# Patient Record
Sex: Female | Born: 1960 | Race: White | Hispanic: No | Marital: Married | Smoking: Former smoker
Health system: Southern US, Community
[De-identification: ages and names within clinical notes are randomized; demographics above are authoritative.]

## PROBLEM LIST (undated history)

## (undated) DIAGNOSIS — F32A Depression, unspecified: Secondary | ICD-10-CM

## (undated) DIAGNOSIS — T7840XA Allergy, unspecified, initial encounter: Secondary | ICD-10-CM

## (undated) DIAGNOSIS — R7303 Prediabetes: Secondary | ICD-10-CM

## (undated) DIAGNOSIS — M1990 Unspecified osteoarthritis, unspecified site: Secondary | ICD-10-CM

## (undated) DIAGNOSIS — E785 Hyperlipidemia, unspecified: Secondary | ICD-10-CM

## (undated) DIAGNOSIS — F419 Anxiety disorder, unspecified: Secondary | ICD-10-CM

## (undated) DIAGNOSIS — F329 Major depressive disorder, single episode, unspecified: Secondary | ICD-10-CM

## (undated) HISTORY — PX: TONSILLECTOMY: SUR1361

## (undated) HISTORY — PX: WISDOM TOOTH EXTRACTION: SHX21

## (undated) HISTORY — PX: GUM SURGERY: SHX658

## (undated) HISTORY — DX: Hyperlipidemia, unspecified: E78.5

## (undated) HISTORY — DX: Depression, unspecified: F32.A

## (undated) HISTORY — DX: Prediabetes: R73.03

## (undated) HISTORY — DX: Allergy, unspecified, initial encounter: T78.40XA

## (undated) HISTORY — DX: Unspecified osteoarthritis, unspecified site: M19.90

## (undated) HISTORY — DX: Anxiety disorder, unspecified: F41.9

## (undated) HISTORY — PX: TUBAL LIGATION: SHX77

---
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
## 2016-12-23 LAB — CBC AND DIFFERENTIAL
HEMATOCRIT: 44 (ref 36–46)
Hemoglobin: 14.7 (ref 12.0–16.0)
NEUTROS ABS: 3046
PLATELETS: 302 (ref 150–399)
WBC: 6.4

## 2016-12-23 LAB — BASIC METABOLIC PANEL
BUN: 14 (ref 4–21)
Creatinine: 1 (ref <=1.1)
GLUCOSE: 91
Potassium: 4.5 (ref 3.4–5.3)
SODIUM: 141 (ref 137–147)

## 2016-12-23 LAB — LIPID PANEL
Cholesterol: 277 — AB (ref 0–200)
HDL: 39 (ref 35–70)
LDL CALC: 209
TRIGLYCERIDES: 137 (ref 40–160)

## 2016-12-23 LAB — HEPATIC FUNCTION PANEL
ALT: 27 (ref 7–35)
AST: 25 (ref 13–35)
Alkaline Phosphatase: 78 (ref 25–125)
Bilirubin, Total: 0.5

## 2016-12-23 LAB — HEMOGLOBIN A1C: Hgb A1c MFr Bld: 5.7 (ref 4.0–6.0)

## 2016-12-23 LAB — TSH: TSH: 3.75 (ref <=5.90)

## 2018-05-17 NOTE — Progress Notes (Signed)
Subjective:    Patient ID: Barbara [name], female    DOB: 1961/01/11, 57 y.o.   MRN: [id number]  HPI :  Barbara [name] is here to establish as a new pt.  She is a pleasant 57 year old female. PMH: She denies chronic "formal medical conditions", however has several concerns today. 1) Hx of herniated disc >20 years ago, confirmed via MRI per pt.  She has been treated by chiropractor in past with only minimal sx relief. She reports intermittent low back pain with radiation down RLE. She also reports numbness/tingling in RLE 2) Pain in R axillary that will radiate to middle humerus Pain is intermittent, described as dull ache and rated 1/10 She denies numbness/tingling in R hand  Pain has been present >1 year She denies acute injury/trauma prior to onset of sx's Her last mammogram >5 years ago and was normal She denies first degree family hx of breast ca 3) "Worried about my sugars" Last set of labs >2 years, denies being told that she was either pre-diabetic or diabetic She estimates to drink 10 ox water/day, consumes two cups of coffee throughout day She quite tobacco use 2013 and has not consumed ETOH >2 years She denies regular exercise She stopped Citalopram 5mg  QD several months ago and reports stable mood She estimates to have had gained >30 lbs in the last 5 years  Patient Care Team    Relationship Specialty Notifications Start End  Esaw Grandchild, NP PCP - General Family Medicine  04/17/18     Patient Active Problem List   Diagnosis Date Noted  . Healthcare maintenance 05/18/2018  . Screening for breast cancer 05/18/2018  . Axillary pain, right 05/18/2018     History reviewed. No pertinent past medical history.   Past Surgical History:  Procedure Laterality Date  . [place]  . TUBAL LIGATION       Family History  Problem Relation Age of Onset  . Heart attack Father   . Diabetes Sister   . Heart attack Brother   . Diabetes Brother   .  Heart attack Maternal Grandfather   . Diabetes Maternal Grandfather   . Cancer Paternal Grandmother 58       breast  . Heart attack Paternal Grandfather      Social History   Substance and Sexual Activity  Drug Use Never     Social History   Substance and Sexual Activity  Alcohol Use Never  . Frequency: Never     Social History   Tobacco Use  Smoking Status Former Smoker  . Packs/day: 0.75  . Years: 30.00  . Pack years: 22.50  . Types: Cigarettes  . Last attempt to quit: 09/21/2011  . Years since quitting: 6.6  Smokeless Tobacco Never Used     No outpatient encounter medications on file as of 05/18/2018.   No facility-administered encounter medications on file as of 05/18/2018.     Allergies: Patient has no known allergies.  Body mass index is 23.66 kg/m.  Blood pressure 93/61, pulse 61, height 5' 3.5" (1.613 m), weight 135 lb 11.2 oz (61.6 kg), SpO2 98 %.  Review of Systems  Constitutional: Positive for fatigue. Negative for activity change, appetite change, chills, diaphoresis, fever and unexpected weight change.  HENT: Negative for congestion.   Respiratory: Negative for cough, chest tightness, shortness of breath, wheezing and stridor.   Cardiovascular: Negative for chest pain, palpitations and leg swelling.  Gastrointestinal: Negative for abdominal distention, abdominal  pain, blood in stool, constipation, diarrhea, nausea and vomiting.  Genitourinary: Negative for difficulty urinating, flank pain and hematuria.  Musculoskeletal: Positive for arthralgias, back pain and myalgias. Negative for gait problem, joint swelling, neck pain and neck stiffness.  Skin: Negative.  Negative for pallor, rash and wound.  Neurological: Negative for dizziness and headaches.  Hematological: Does not bruise/bleed easily.  Psychiatric/Behavioral: Negative for behavioral problems, confusion, decreased concentration, dysphoric mood, hallucinations, self-injury, sleep disturbance  and suicidal ideas. The patient is not nervous/anxious and is not hyperactive.        Objective:   Physical Exam  Constitutional: She is oriented to person, place, and time. She appears well-developed and well-nourished. No distress.  HENT:  Head: Normocephalic and atraumatic.  Right Ear: External ear normal.  Left Ear: External ear normal.  Nose: Nose normal.  Mouth/Throat: Oropharynx is clear and moist.  Cardiovascular: Normal rate, regular rhythm, normal heart sounds and intact distal pulses.  Pulmonary/Chest: Effort normal and breath sounds normal. No stridor. No respiratory distress. She has no wheezes. She has no rales. She exhibits no tenderness.  Musculoskeletal: She exhibits tenderness. She exhibits no edema.       Lumbar back: She exhibits tenderness.       Right upper arm: She exhibits tenderness and swelling. She exhibits no bony tenderness, no edema, no deformity and no laceration.       Left upper arm: Normal.  Neurological: She is alert and oriented to person, place, and time. Coordination normal.  Skin: Skin is warm and dry. Capillary refill takes less than 2 seconds. No rash noted. She is not diaphoretic. No erythema. No pallor.  Psychiatric: She has a normal mood and affect. Her behavior is normal. Judgment and thought content normal.  Nursing note and vitals reviewed.     Assessment & Plan:   1. Screening for breast cancer   2. Screening for colon cancer   3. Former heavy tobacco smoker   4. Healthcare maintenance   5. Need for zoster vaccination   6. Family history of diabetes mellitus in first degree relative   7. Axillary pain, right   8. Back pain with radiculopathy     Healthcare maintenance  Physical Therapy referral placed. Ultrasound of right armpit/upper extremity placed. Recommend completing your mammogram ASAP. Return in 3 weeks for fasting labs and 4 weeks for complete physical. Increase water intake, strive for at least 65 ounces/day.    Follow Mediterranean Diet. Increase regular exercise.  Recommend at least 30 minutes daily, 5 days per week of walking, jogging, biking, swimming, YouTube/Pinterest workout videos.  Screening for breast cancer Mammogram ordered  Axillary pain, right [place] ordered    FOLLOW-UP:  Return in about 1 month (around 06/18/2018) for CPE, Fasting Labs.

## 2018-05-18 ENCOUNTER — Ambulatory Visit (INDEPENDENT_AMBULATORY_CARE_PROVIDER_SITE_OTHER): Payer: 59 | Admitting: Adult Health

## 2018-05-18 ENCOUNTER — Ambulatory Visit: Payer: Self-pay | Admitting: Adult Health

## 2018-05-18 ENCOUNTER — Other Ambulatory Visit: Payer: Self-pay | Admitting: Adult Health

## 2018-05-18 ENCOUNTER — Encounter: Payer: Self-pay | Admitting: Adult Health

## 2018-05-18 ENCOUNTER — Telehealth: Payer: Self-pay | Admitting: Adult Health

## 2018-05-18 VITALS — BP 93/61 | HR 61 | Ht 63.5 in | Wt 135.7 lb

## 2018-05-18 DIAGNOSIS — M79621 Pain in right upper arm: Secondary | ICD-10-CM

## 2018-05-18 DIAGNOSIS — Z1211 Encounter for screening for malignant neoplasm of colon: Secondary | ICD-10-CM

## 2018-05-18 DIAGNOSIS — Z1231 Encounter for screening mammogram for malignant neoplasm of breast: Secondary | ICD-10-CM

## 2018-05-18 DIAGNOSIS — Z833 Family history of diabetes mellitus: Secondary | ICD-10-CM

## 2018-05-18 DIAGNOSIS — Z1239 Encounter for other screening for malignant neoplasm of breast: Secondary | ICD-10-CM

## 2018-05-18 DIAGNOSIS — Z0000 Encounter for general adult medical examination without abnormal findings: Secondary | ICD-10-CM | POA: Insufficient documentation

## 2018-05-18 DIAGNOSIS — Z87891 Personal history of nicotine dependence: Secondary | ICD-10-CM

## 2018-05-18 DIAGNOSIS — M5410 Radiculopathy, site unspecified: Secondary | ICD-10-CM

## 2018-05-18 DIAGNOSIS — Z23 Encounter for immunization: Secondary | ICD-10-CM

## 2018-05-18 NOTE — Patient Instructions (Signed)
Mediterranean Diet A Mediterranean diet refers to food and lifestyle choices that are based on the traditions of countries located on the [place] Sea. This way of eating has been shown to help prevent certain conditions and improve outcomes for people who have chronic diseases, like kidney disease and heart disease. What are tips for following this plan? Lifestyle  Cook and eat meals together with your family, when possible.  Drink enough fluid to keep your urine clear or pale yellow.  Be physically active every day. This includes: ? Aerobic exercise like running or swimming. ? Leisure activities like gardening, walking, or housework.  Get 7-8 hours of sleep each night.  If recommended by your health care provider, drink red wine in moderation. This means 1 glass a day for nonpregnant women and 2 glasses a day for men. A glass of wine equals 5 oz (150 mL). Reading food labels  Check the serving size of packaged foods. For foods such as rice and pasta, the serving size refers to the amount of cooked product, not dry.  Check the total fat in packaged foods. Avoid foods that have saturated fat or trans fats.  Check the ingredients list for added sugars, such as corn syrup. Shopping  At the grocery store, buy most of your food from the areas near the walls of the store. This includes: ? Fresh fruits and vegetables (produce). ? Grains, beans, nuts, and seeds. Some of these may be available in unpackaged forms or large amounts (in bulk). ? Fresh seafood. ? Poultry and eggs. ? Low-fat dairy products.  Buy whole ingredients instead of prepackaged foods.  Buy fresh fruits and vegetables in-season from local farmers markets.  Buy frozen fruits and vegetables in resealable bags.  If you do not have access to quality fresh seafood, buy precooked frozen shrimp or canned fish, such as tuna, salmon, or sardines.  Buy small amounts of raw or cooked vegetables, salads, or olives from the  deli or salad bar at your store.  Stock your pantry so you always have certain foods on hand, such as olive oil, canned tuna, canned tomatoes, rice, pasta, and beans. Cooking  Cook foods with extra-virgin olive oil instead of using butter or other vegetable oils.  Have meat as a side dish, and have vegetables or grains as your main dish. This means having meat in small portions or adding small amounts of meat to foods like pasta or stew.  Use beans or vegetables instead of meat in common dishes like chili or lasagna.  Experiment with different cooking methods. Try roasting or broiling vegetables instead of steaming or sauteing them.  Add frozen vegetables to soups, stews, pasta, or rice.  Add nuts or seeds for added healthy fat at each meal. You can add these to yogurt, salads, or vegetable dishes.  Marinate fish or vegetables using olive oil, lemon juice, garlic, and fresh herbs. Meal planning  Plan to eat 1 vegetarian meal one day each week. Try to work up to 2 vegetarian meals, if possible.  Eat seafood 2 or more times a week.  Have healthy snacks readily available, such as: ? Vegetable sticks with hummus. ? Greek yogurt. ? Fruit and nut trail mix.  Eat balanced meals throughout the week. This includes: ? Fruit: 2-3 servings a day ? Vegetables: 4-5 servings a day ? Low-fat dairy: 2 servings a day ? Fish, poultry, or lean meat: 1 serving a day ? Beans and legumes: 2 or more servings a week ? Nuts   and seeds: 1-2 servings a day ? Whole grains: 6-8 servings a day ? Extra-virgin olive oil: 3-4 servings a day  Limit red meat and sweets to only a few servings a month What are my food choices?  Mediterranean diet ? Recommended ? Grains: Whole-grain pasta. Brown rice. Bulgar wheat. Polenta. Couscous. Whole-wheat bread. [place]. ? Vegetables: Artichokes. Beets. Broccoli. Cabbage. Carrots. Eggplant. Green beans. Chard. Kale. Spinach. Onions. Leeks. Peas. Squash.  Tomatoes. Peppers. Radishes. ? Fruits: Apples. Apricots. Avocado. Berries. Bananas. Cherries. Dates. Figs. Grapes. Lemons. Melon. Oranges. Peaches. Plums. Pomegranate. ? Meats and other protein foods: Beans. Almonds. Sunflower seeds. Pine nuts. Peanuts. [place]. Salmon. Scallops. Shrimp. [place]. Tilapia. Clams. Oysters. Eggs. ? Dairy: Low-fat milk. Cheese. Greek yogurt. ? Beverages: Water. Red wine. Herbal tea. ? Fats and oils: Extra virgin olive oil. Avocado oil. Grape seed oil. ? Sweets and desserts: [place] yogurt with honey. Baked apples. Poached pears. Trail mix. ? Seasoning and other foods: Basil. Cilantro. Coriander. Cumin. Mint. Parsley. Sage. Rosemary. Tarragon. Garlic. Oregano. Thyme. Pepper. Balsalmic vinegar. Tahini. Hummus. Tomato sauce. Olives. Mushrooms. ? Limit these ? Grains: Prepackaged pasta or rice dishes. Prepackaged cereal with added sugar. ? Vegetables: Deep fried potatoes (french fries). ? Fruits: Fruit canned in syrup. ? Meats and other protein foods: Beef. Pork. Lamb. Poultry with skin. Hot dogs. [place] Salines. ? Dairy: Ice cream. Sour cream. Whole milk. ? Beverages: Juice. Sugar-sweetened soft drinks. Beer. Liquor and spirits. ? Fats and oils: Butter. Canola oil. Vegetable oil. Beef fat (tallow). Lard. ? Sweets and desserts: Cookies. Cakes. Pies. Candy. ? Seasoning and other foods: Mayonnaise. Premade sauces and marinades. ? The items listed may not be a complete list. Talk with your dietitian about what dietary choices are right for you. Summary  The Mediterranean diet includes both food and lifestyle choices.  Eat a variety of fresh fruits and vegetables, beans, nuts, seeds, and whole grains.  Limit the amount of red meat and sweets that you eat.  Talk with your health care provider about whether it is safe for you to drink red wine in moderation. This means 1 glass a day for nonpregnant women and 2 glasses a day for men. A glass of wine equals 5 oz (150 mL). This information  is not intended to replace advice given to you by your health care provider. Make sure you discuss any questions you have with your health care provider. Document Released: 04/29/2016 Document Revised: 06/01/2016 Document Reviewed: 04/29/2016 Elsevier Interactive Patient Education  2018 [employer].   Physical Therapy referral placed. Ultrasound of right armpit/upper extremity placed. Recommend completing your mammogram ASAP. Return in 3 weeks for fasting labs and 4 weeks for complete physical. Increase water intake, strive for at least 65 ounces/day.   Follow Mediterranean Diet. Increase regular exercise.  Recommend at least 30 minutes daily, 5 days per week of walking, jogging, biking, swimming, YouTube/Pinterest workout videos. WELCOME TO THE PRACTICE!

## 2018-05-18 NOTE — Assessment & Plan Note (Signed)
  Physical Therapy referral placed. Ultrasound of right armpit/upper extremity placed. Recommend completing your mammogram ASAP. Return in 3 weeks for fasting labs and 4 weeks for complete physical. Increase water intake, strive for at least 65 ounces/day.   Follow Mediterranean Diet. Increase regular exercise.  Recommend at least 30 minutes daily, 5 days per week of walking, jogging, biking, swimming, YouTube/Pinterest workout videos.

## 2018-05-18 NOTE — Assessment & Plan Note (Signed)
US ordered

## 2018-05-18 NOTE — Assessment & Plan Note (Signed)
Mammogram ordered

## 2018-05-18 NOTE — Telephone Encounter (Signed)
[facility] rep/ Fabi says she was in the middle of scheduling a CT scan for this patient when it went away (or was cancelled) she wishes nurse to call her to clarify if still needed.  --forwarding message to medical assistant to call [place] at [phone] ext- [phone] for Fabi.  --glh

## 2018-05-18 NOTE — Telephone Encounter (Signed)
Fabi spoke with Mina Marble, NP regarding imaging orders.  Charyl Bigger, CMA

## 2018-05-29 ENCOUNTER — Telehealth: Payer: Self-pay | Admitting: Adult Health

## 2018-05-29 DIAGNOSIS — R2231 Localized swelling, mass and lump, right upper limb: Secondary | ICD-10-CM

## 2018-05-29 NOTE — Telephone Encounter (Signed)
Please advise.  T. Nelson, CMA 

## 2018-05-29 NOTE — Telephone Encounter (Signed)
[facility] rep states dx of (pain) not speciefic enough for ordered US of soft tissue, non vascular & they request provider review for correction or change to a more accepted one (such as cyst, mass or a acceptable one ).  --forwarding message to medical assistant for review w/provider & for [facility] to be contacted at [phone], option 1 then 5)  ---glh

## 2018-05-29 NOTE — Telephone Encounter (Signed)
Good Afternoon Tonya, Can you please change order to possible mass of RUE. Thanks! Valetta Fuller

## 2018-05-30 ENCOUNTER — Other Ambulatory Visit (INDEPENDENT_AMBULATORY_CARE_PROVIDER_SITE_OTHER): Payer: 59

## 2018-05-30 DIAGNOSIS — Z833 Family history of diabetes mellitus: Secondary | ICD-10-CM

## 2018-05-30 DIAGNOSIS — Z0000 Encounter for general adult medical examination without abnormal findings: Secondary | ICD-10-CM

## 2018-05-30 NOTE — Telephone Encounter (Signed)
New order for [place] of right axilla with new dx entered.  Charyl Bigger, CMA

## 2018-05-31 ENCOUNTER — Other Ambulatory Visit (INDEPENDENT_AMBULATORY_CARE_PROVIDER_SITE_OTHER): Payer: 59

## 2018-05-31 DIAGNOSIS — Z0000 Encounter for general adult medical examination without abnormal findings: Secondary | ICD-10-CM

## 2018-05-31 DIAGNOSIS — Z833 Family history of diabetes mellitus: Secondary | ICD-10-CM

## 2018-06-01 ENCOUNTER — Telehealth: Payer: Self-pay | Admitting: Adult Health

## 2018-06-01 LAB — COMPREHENSIVE METABOLIC PANEL
ALK PHOS: 85 IU/L (ref 39–117)
ALT: 22 IU/L (ref 0–32)
AST: 22 IU/L (ref 0–40)
Albumin/Globulin Ratio: 1.5 (ref 1.2–2.2)
Albumin: 4.5 g/dL (ref 3.5–5.5)
BILIRUBIN TOTAL: 0.3 mg/dL (ref 0.0–1.2)
BUN / CREAT RATIO: 17 (ref 9–23)
BUN: 14 mg/dL (ref 6–24)
CO2: 21 mmol/L (ref 20–29)
CREATININE: 0.82 mg/dL (ref 0.57–1.00)
Calcium: 9.4 mg/dL (ref 8.7–10.2)
Chloride: 105 mmol/L (ref 96–106)
GFR calc Af Amer: 91 mL/min/{1.73_m2} (ref >59)
GFR calc non Af Amer: 79 mL/min/{1.73_m2} (ref >59)
GLUCOSE: 102 mg/dL — AB (ref 65–99)
Globulin, Total: 3 g/dL (ref 1.5–4.5)
Potassium: 5 mmol/L (ref 3.5–5.2)
SODIUM: 141 mmol/L (ref 134–144)
Total Protein: 7.5 g/dL (ref 6.0–8.5)

## 2018-06-01 LAB — LIPID PANEL
CHOL/HDL RATIO: 6.3 ratio — AB (ref 0.0–4.4)
Cholesterol, Total: 247 mg/dL — ABNORMAL HIGH (ref 100–199)
HDL: 39 mg/dL — AB (ref >39)
LDL Calculated: 179 mg/dL — ABNORMAL HIGH (ref 0–99)
TRIGLYCERIDES: 145 mg/dL (ref 0–149)
VLDL Cholesterol Cal: 29 mg/dL (ref 5–40)

## 2018-06-01 LAB — CBC WITH DIFFERENTIAL/PLATELET
BASOS ABS: 0.1 10*3/uL (ref 0.0–0.2)
Basos: 1 %
EOS (ABSOLUTE): 0.5 10*3/uL — ABNORMAL HIGH (ref 0.0–0.4)
Eos: 8 %
HEMOGLOBIN: 14.1 g/dL (ref 11.1–15.9)
Hematocrit: 42.3 % (ref 34.0–46.6)
Immature Grans (Abs): 0 10*3/uL (ref 0.0–0.1)
Immature Granulocytes: 0 %
LYMPHS ABS: 2.4 10*3/uL (ref 0.7–3.1)
Lymphs: 36 %
MCH: 30.3 pg (ref 26.6–33.0)
MCHC: 33.3 g/dL (ref 31.5–35.7)
MCV: 91 fL (ref 79–97)
MONOCYTES: 9 %
MONOS ABS: 0.6 10*3/uL (ref 0.1–0.9)
NEUTROS ABS: 3 10*3/uL (ref 1.4–7.0)
Neutrophils: 46 %
Platelets: 293 10*3/uL (ref 150–450)
RBC: 4.66 x10E6/uL (ref 3.77–5.28)
RDW: 12.7 % (ref 12.3–15.4)
WBC: 6.5 10*3/uL (ref 3.4–10.8)

## 2018-06-01 LAB — TSH: TSH: 3.6 u[IU]/mL (ref 0.450–4.500)

## 2018-06-01 LAB — HEMOGLOBIN A1C
ESTIMATED AVERAGE GLUCOSE: 117 mg/dL
HEMOGLOBIN A1C: 5.7 % — AB (ref 4.8–5.6)

## 2018-06-01 NOTE — Telephone Encounter (Signed)
Patient came in for labs but did not have an active MyChart at the time. We have now set that up for her and she is requesting her current lab work be available to view on [place], can we do this for the patient?

## 2018-06-01 NOTE — Telephone Encounter (Signed)
MyChart message sent to pt with results.  T. Nelson, CMA  

## 2018-06-06 ENCOUNTER — Ambulatory Visit
Admission: RE | Admit: 2018-06-06 | Discharge: 2018-06-06 | Disposition: A | Discharge status: Home / Self Care | Payer: 59 | Source: Ambulatory Visit | Attending: Adult Health | Admitting: Adult Health

## 2018-06-06 DIAGNOSIS — M79621 Pain in right upper arm: Secondary | ICD-10-CM

## 2018-06-07 NOTE — Progress Notes (Signed)
Subjective:    Patient ID: Barbara [name], female    DOB: Oct 03, 1960, 57 y.o.   MRN: [id number]  HPI :05/18/18 OV:   Barbara [name] is here to establish as a new pt.  She is a pleasant 57 year old female. PMH: She denies chronic "formal medical conditions", however has several concerns today. 1) Hx of herniated disc >20 years ago, confirmed via MRI per pt.  She has been treated by chiropractor in past with only minimal sx relief. She reports intermittent low back pain with radiation down RLE. She also reports numbness/tingling in RLE 2) Pain in R axillary that will radiate to middle humerus Pain is intermittent, described as dull ache and rated 1/10 She denies numbness/tingling in R hand  Pain has been present >1 year She denies acute injury/trauma prior to onset of sx's Her last mammogram >5 years ago and was normal She denies first degree family hx of breast ca 3) "Worried about my sugars" Last set of labs >2 years, denies being told that she was either pre-diabetic or diabetic She estimates to drink 10 ox water/day, consumes two cups of coffee throughout day She quite tobacco use 2013 and has not consumed ETOH >2 years She denies regular exercise She stopped Citalopram 5mg  QD several months ago and reports stable mood She estimates to have had gained >30 lbs in the last 5 years  06/12/18 OV: [name] is here for CPE Reviewed all recent labs- LDL- 179 A1c- 5.7 CBC repeated to re-check EOS level  She has been reducing saturated fat and just started a twice weekly bike program. She has lost 2 lbs since last OV 05/18/18, goal wt loss is 10 lbs She continues to abstain from tobacco/ETOH use She was reassured with recent mammogram/R axillary imaging  Healthcare Maintenance: PAP-UTD, last normal per pt- records requested Mammogram-UTD, 06/06/18- will repeat one year Colonoscopy-declined colonoscopy- will complete colorguard  Immunizations-UTD  Patient Care Team    Relationship  Specialty Notifications Start End  Esaw Grandchild, NP PCP - General Family Medicine  04/17/18     Patient Active Problem List   Diagnosis Date Noted  . Abnormal CBC 06/12/2018  . Low blood pressure reading 06/12/2018  . Healthcare maintenance 05/18/2018  . Screening for breast cancer 05/18/2018  . Axillary pain, right 05/18/2018     History reviewed. No pertinent past medical history.   Past Surgical History:  Procedure Laterality Date  . [place]  . TUBAL LIGATION       Family History  Problem Relation Age of Onset  . Heart attack Father   . Diabetes Sister   . Heart attack Brother   . Diabetes Brother   . Heart attack Maternal Grandfather   . Diabetes Maternal Grandfather   . Cancer Paternal Grandmother 43       breast  . Breast cancer Paternal Grandmother   . Heart attack Paternal Grandfather      Social History   Substance and Sexual Activity  Drug Use Never     Social History   Substance and Sexual Activity  Alcohol Use Never  . Frequency: Never     Social History   Tobacco Use  Smoking Status Former Smoker  . Packs/day: 0.75  . Years: 30.00  . Pack years: 22.50  . Types: Cigarettes  . Last attempt to quit: 09/21/2011  . Years since quitting: 6.7  Smokeless Tobacco Never Used     No outpatient encounter medications on  file as of 06/12/2018.   No facility-administered encounter medications on file as of 06/12/2018.     Allergies: Patient has no known allergies.  Body mass index is 23.26 kg/m.  Blood pressure 96/64, pulse 78, height 5' 3.5" (1.613 m), weight 133 lb 6.4 oz (60.5 kg), SpO2 98 %.  Review of Systems  Constitutional: Positive for fatigue. Negative for activity change, appetite change, chills, diaphoresis, fever and unexpected weight change.  HENT: Negative for congestion.   Respiratory: Negative for cough, chest tightness, shortness of breath, wheezing and stridor.   Cardiovascular: Negative for  chest pain, palpitations and leg swelling.  Gastrointestinal: Negative for abdominal distention, abdominal pain, blood in stool, constipation, diarrhea, nausea and vomiting.  Genitourinary: Negative for difficulty urinating, flank pain and hematuria.  Musculoskeletal: Positive for arthralgias, back pain and myalgias. Negative for gait problem, joint swelling, neck pain and neck stiffness.  Skin: Negative.  Negative for pallor, rash and wound.  Neurological: Negative for dizziness and headaches.  Hematological: Does not bruise/bleed easily.  Psychiatric/Behavioral: Negative for behavioral problems, confusion, decreased concentration, dysphoric mood, hallucinations, self-injury, sleep disturbance and suicidal ideas. The patient is not nervous/anxious and is not hyperactive.        Objective:   Physical Exam  Constitutional: She is oriented to person, place, and time. She appears well-developed and well-nourished. No distress.  HENT:  Head: Normocephalic and atraumatic.  Right Ear: External ear normal.  Left Ear: External ear normal.  Nose: Nose normal.  Mouth/Throat: Oropharynx is clear and moist.  Cardiovascular: Normal rate, regular rhythm, normal heart sounds and intact distal pulses.  Pulmonary/Chest: Effort normal and breath sounds normal. No stridor. No respiratory distress. She has no wheezes. She has no rales. She exhibits no tenderness.  Musculoskeletal: She exhibits tenderness. She exhibits no edema.       Lumbar back: She exhibits tenderness.       Right upper arm: She exhibits tenderness and swelling. She exhibits no bony tenderness, no edema, no deformity and no laceration.       Left upper arm: Normal.  Neurological: She is alert and oriented to person, place, and time. Coordination normal.  Skin: Skin is warm and dry. Capillary refill takes less than 2 seconds. No rash noted. She is not diaphoretic. No erythema. No pallor.  Psychiatric: She has a normal mood and affect. Her  behavior is normal. Judgment and thought content normal.  Nursing note and vitals reviewed.     Assessment & Plan:   1. Need for Tdap vaccination   2. Abnormal CBC   3. Healthcare maintenance   4. Axillary pain, right   5. Low blood pressure reading     Healthcare maintenance  Increase water intake, strive for at least 65 ounces/day.   Follow Mediterranean diet Increase regular exercise.  Recommend at least 30 minutes daily, 5 days per week of walking, jogging, biking, swimming, YouTube/Pinterest workout videos. We will call you when lab results are available. Recommend follow-up in 6 months for office visit and repeat fasting labs.  Axillary pain, right 06/06/18 R Axillary [place]: IMPRESSION: 1. No mammographic evidence of malignancy in either breast. 2. No suspicious sonographic findings corresponding to the patient's right axillary swelling. Recommendation is for clinical and symptomatic follow-up.  Abnormal CBC Slightly elevated EOS, CBC repeated today She denies personal/famkily hx of autoimmune disorders  Low blood pressure reading Her baseline pressure readiings are SBP 90s DBP 60s She denies dizziness/fatigue/confusion    FOLLOW-UP:  Return in about 6 months (  around 12/11/2018) for Regular Follow Up, Fasting Labs.

## 2018-06-12 ENCOUNTER — Encounter: Payer: Self-pay | Admitting: Adult Health

## 2018-06-12 ENCOUNTER — Ambulatory Visit (INDEPENDENT_AMBULATORY_CARE_PROVIDER_SITE_OTHER): Payer: 59 | Admitting: Adult Health

## 2018-06-12 VITALS — BP 96/64 | HR 78 | Ht 63.5 in | Wt 133.4 lb

## 2018-06-12 DIAGNOSIS — R7989 Other specified abnormal findings of blood chemistry: Secondary | ICD-10-CM

## 2018-06-12 DIAGNOSIS — M79621 Pain in right upper arm: Secondary | ICD-10-CM | POA: Diagnosis not present

## 2018-06-12 DIAGNOSIS — Z0000 Encounter for general adult medical examination without abnormal findings: Secondary | ICD-10-CM | POA: Diagnosis not present

## 2018-06-12 DIAGNOSIS — Z23 Encounter for immunization: Secondary | ICD-10-CM

## 2018-06-12 DIAGNOSIS — R031 Nonspecific low blood-pressure reading: Secondary | ICD-10-CM

## 2018-06-12 NOTE — Assessment & Plan Note (Signed)
06/06/18 R Axillary [place]: IMPRESSION: 1. No mammographic evidence of malignancy in either breast. 2. No suspicious sonographic findings corresponding to the patient's right axillary swelling. Recommendation is for clinical and symptomatic follow-up.

## 2018-06-12 NOTE — Assessment & Plan Note (Signed)
  Increase water intake, strive for at least 65 ounces/day.   Follow Mediterranean diet Increase regular exercise.  Recommend at least 30 minutes daily, 5 days per week of walking, jogging, biking, swimming, YouTube/Pinterest workout videos. We will call you when lab results are available. Recommend follow-up in 6 months for office visit and repeat fasting labs.

## 2018-06-12 NOTE — Patient Instructions (Signed)
Preventive Care for Adults, Female  A healthy lifestyle and preventive care can promote health and wellness. Preventive health guidelines for women include the following key practices.   A routine yearly physical is a good way to check with your health care provider about your health and preventive screening. It is a chance to share any concerns and updates on your health and to receive a thorough exam.   Visit your dentist for a routine exam and preventive care every 6 months. Brush your teeth twice a day and floss once a day. Good oral hygiene prevents tooth decay and gum disease.   The frequency of eye exams is based on your age, health, family medical history, use of contact lenses, and other factors. Follow your health care provider's recommendations for frequency of eye exams.   Eat a healthy diet. Foods like vegetables, fruits, whole grains, low-fat dairy products, and lean protein foods contain the nutrients you need without too many calories. Decrease your intake of foods high in solid fats, added sugars, and salt. Eat the right amount of calories for you.Get information about a proper diet from your health care provider, if necessary.   Regular physical exercise is one of the most important things you can do for your health. Most adults should get at least 150 minutes of moderate-intensity exercise (any activity that increases your heart rate and causes you to sweat) each week. In addition, most adults need muscle-strengthening exercises on 2 or more days a week.   Maintain a healthy weight. The body mass index (BMI) is a screening tool to identify possible weight problems. It provides an estimate of body fat based on height and weight. Your health care provider can find your BMI, and can help you achieve or maintain a healthy weight.For adults 20 years and older:   - A BMI below 18.5 is considered underweight.   - A BMI of 18.5 to 24.9 is normal.   - A BMI of 25 to 29.9 is  considered overweight.   - A BMI of 30 and above is considered obese.   Maintain normal blood lipids and cholesterol levels by exercising and minimizing your intake of trans and saturated fats.  Eat a balanced diet with plenty of fruit and vegetables. Blood tests for lipids and cholesterol should begin at age 20 and be repeated every 5 years minimum.  If your lipid or cholesterol levels are high, you are over 40, or you are at high risk for heart disease, you may need your cholesterol levels checked more frequently.Ongoing high lipid and cholesterol levels should be treated with medicines if diet and exercise are not working.   If you smoke, find out from your health care provider how to quit. If you do not use tobacco, do not start.   Lung cancer screening is recommended for adults aged 55-80 years who are at high risk for developing lung cancer because of a history of smoking. A yearly low-dose CT scan of the lungs is recommended for people who have at least a 30-pack-year history of smoking and are a current smoker or have quit within the past 15 years. A pack year of smoking is smoking an average of 1 pack of cigarettes a day for 1 year (for example: 1 pack a day for 30 years or 2 packs a day for 15 years). Yearly screening should continue until the smoker has stopped smoking for at least 15 years. Yearly screening should be stopped for people who develop a   health problem that would prevent them from having lung cancer treatment.   If you are pregnant, do not drink alcohol. If you are breastfeeding, be very cautious about drinking alcohol. If you are not pregnant and choose to drink alcohol, do not have more than 1 drink per day. One drink is considered to be 12 ounces (355 mL) of beer, 5 ounces (148 mL) of wine, or 1.5 ounces (44 mL) of liquor.   Avoid use of street drugs. Do not share needles with anyone. Ask for help if you need support or instructions about stopping the use of  drugs.   High blood pressure causes heart disease and increases the risk of stroke. Your blood pressure should be checked at least yearly.  Ongoing high blood pressure should be treated with medicines if weight loss and exercise do not work.   If you are 69-55 years old, ask your health care provider if you should take aspirin to prevent strokes.   Diabetes screening involves taking a blood sample to check your fasting blood sugar level. This should be done once every 3 years, after age 38, if you are within normal weight and without risk factors for diabetes. Testing should be considered at a younger age or be carried out more frequently if you are overweight and have at least 1 risk factor for diabetes.   Breast cancer screening is essential preventive care for women. You should practice "breast self-awareness."  This means understanding the normal appearance and feel of your breasts and may include breast self-examination.  Any changes detected, no matter how small, should be reported to a health care provider.  Women in their 80s and 30s should have a clinical breast exam (CBE) by a health care provider as part of a regular health exam every 1 to 3 years.  After age 66, women should have a CBE every year.  Starting at age 1, women should consider having a mammogram (breast X-ray test) every year.  Women who have a family history of breast cancer should talk to their health care provider about genetic screening.  Women at a high risk of breast cancer should talk to their health care providers about having an MRI and a mammogram every year.   -Breast cancer gene (BRCA)-related cancer risk assessment is recommended for women who have family members with BRCA-related cancers. BRCA-related cancers include breast, ovarian, tubal, and peritoneal cancers. Having family members with these cancers may be associated with an increased risk for harmful changes (mutations) in the breast cancer genes BRCA1 and  BRCA2. Results of the assessment will determine the need for genetic counseling and BRCA1 and BRCA2 testing.   The Pap test is a screening test for cervical cancer. A Pap test can show cell changes on the cervix that might become cervical cancer if left untreated. A Pap test is a procedure in which cells are obtained and examined from the lower end of the uterus (cervix).   - Women should have a Pap test starting at age 57.   - Between ages 90 and 70, Pap tests should be repeated every 2 years.   - Beginning at age 63, you should have a Pap test every 3 years as long as the past 3 Pap tests have been normal.   - Some women have medical problems that increase the chance of getting cervical cancer. Talk to your health care provider about these problems. It is especially important to talk to your health care provider if a  new problem develops soon after your last Pap test. In these cases, your health care provider may recommend more frequent screening and Pap tests.   - The above recommendations are the same for women who have or have not gotten the vaccine for human papillomavirus (HPV).   - If you had a hysterectomy for a problem that was not cancer or a condition that could lead to cancer, then you no longer need Pap tests. Even if you no longer need a Pap test, a regular exam is a good idea to make sure no other problems are starting.   - If you are between ages 36 and 66 years, and you have had normal Pap tests going back 10 years, you no longer need Pap tests. Even if you no longer need a Pap test, a regular exam is a good idea to make sure no other problems are starting.   - If you have had past treatment for cervical cancer or a condition that could lead to cancer, you need Pap tests and screening for cancer for at least 20 years after your treatment.   - If Pap tests have been discontinued, risk factors (such as a new sexual partner) need to be reassessed to determine if screening should  be resumed.   - The HPV test is an additional test that may be used for cervical cancer screening. The HPV test looks for the virus that can cause the cell changes on the cervix. The cells collected during the Pap test can be tested for HPV. The HPV test could be used to screen women aged 70 years and older, and should be used in women of any age who have unclear Pap test results. After the age of 67, women should have HPV testing at the same frequency as a Pap test.   Colorectal cancer can be detected and often prevented. Most routine colorectal cancer screening begins at the age of 57 years and continues through age 26 years. However, your health care provider may recommend screening at an earlier age if you have risk factors for colon cancer. On a yearly basis, your health care provider may provide home test kits to check for hidden blood in the stool.  Use of a small camera at the end of a tube, to directly examine the colon (sigmoidoscopy or colonoscopy), can detect the earliest forms of colorectal cancer. Talk to your health care provider about this at age 23, when routine screening begins. Direct exam of the colon should be repeated every 5 -10 years through age 49 years, unless early forms of pre-cancerous polyps or small growths are found.   People who are at an increased risk for hepatitis B should be screened for this virus. You are considered at high risk for hepatitis B if:  -You were born in a country where hepatitis B occurs often. Talk with your health care provider about which countries are considered high risk.  - Your parents were born in a high-risk country and you have not received a shot to protect against hepatitis B (hepatitis B vaccine).  - You have HIV or AIDS.  - You use needles to inject street drugs.  - You live with, or have sex with, someone who has Hepatitis B.  - You get hemodialysis treatment.  - You take certain medicines for conditions like cancer, organ  transplantation, and autoimmune conditions.   Hepatitis C blood testing is recommended for all people born from [birth year] through 1965 and any individual  with known risks for hepatitis C.   Practice safe sex. Use condoms and avoid high-risk sexual practices to reduce the spread of sexually transmitted infections (STIs). STIs include gonorrhea, chlamydia, syphilis, trichomonas, herpes, HPV, and human immunodeficiency virus (HIV). Herpes, HIV, and HPV are viral illnesses that have no cure. They can result in disability, cancer, and death. Sexually active women aged 25 years and younger should be checked for chlamydia. Older women with new or multiple partners should also be tested for chlamydia. Testing for other STIs is recommended if you are sexually active and at increased risk.   Osteoporosis is a disease in which the bones lose minerals and strength with aging. This can result in serious bone fractures or breaks. The risk of osteoporosis can be identified using a bone density scan. Women ages 65 years and over and women at risk for fractures or osteoporosis should discuss screening with their health care providers. Ask your health care provider whether you should take a calcium supplement or vitamin D to There are also several preventive steps women can take to avoid osteoporosis and resulting fractures or to keep osteoporosis from worsening. -->Recommendations include:  Eat a balanced diet high in fruits, vegetables, calcium, and vitamins.  Get enough calcium. The recommended total intake of is 1,200 mg daily; for best absorption, if taking supplements, divide doses into 250-500 mg doses throughout the day. Of the two types of calcium, calcium carbonate is best absorbed when taken with food but calcium citrate can be taken on an empty stomach.  Get enough vitamin D. NAMS and the National Osteoporosis Foundation recommend at least 1,000 IU per day for women age 50 and over who are at risk of vitamin D  deficiency. Vitamin D deficiency can be caused by inadequate sun exposure (for example, those who live in northern latitudes).  Avoid alcohol and smoking. Heavy alcohol intake (more than 7 drinks per week) increases the risk of falls and hip fracture and women smokers tend to lose bone more rapidly and have lower bone mass than nonsmokers. Stopping smoking is one of the most important changes women can make to improve their health and decrease risk for disease.  Be physically active every day. Weight-bearing exercise (for example, fast walking, hiking, jogging, and weight training) may strengthen bones or slow the rate of bone loss that comes with aging. Balancing and muscle-strengthening exercises can reduce the risk of falling and fracture.  Consider therapeutic medications. Currently, several types of effective drugs are available. Healthcare providers can recommend the type most appropriate for each woman.  Eliminate environmental factors that may contribute to accidents. Falls cause nearly 90% of all osteoporotic fractures, so reducing this risk is an important bone-health strategy. Measures include ample lighting, removing obstructions to walking, using nonskid rugs on floors, and placing mats and/or grab bars in showers.  Be aware of medication side effects. Some common medicines make bones weaker. These include a type of steroid drug called glucocorticoids used for arthritis and asthma, some antiseizure drugs, certain sleeping pills, treatments for endometriosis, and some cancer drugs. An overactive thyroid gland or using too much thyroid hormone for an underactive thyroid can also be a problem. If you are taking these medicines, talk to your doctor about what you can do to help protect your bones.reduce the rate of osteoporosis.    Menopause can be associated with physical symptoms and risks. Hormone replacement therapy is available to decrease symptoms and risks. You should talk to your  health care provider   about whether hormone replacement therapy is right for you.   Use sunscreen. Apply sunscreen liberally and repeatedly throughout the day. You should seek shade when your shadow is shorter than you. Protect yourself by wearing long sleeves, pants, a wide-brimmed hat, and sunglasses year round, whenever you are outdoors.   Once a month, do a whole body skin exam, using a mirror to look at the skin on your back. Tell your health care provider of new moles, moles that have irregular borders, moles that are larger than a pencil eraser, or moles that have changed in shape or color.   -Stay current with required vaccines (immunizations).   Influenza vaccine. All adults should be immunized every year.  Tetanus, diphtheria, and acellular pertussis (Td, Tdap) vaccine. Pregnant women should receive 1 dose of Tdap vaccine during each pregnancy. The dose should be obtained regardless of the length of time since the last dose. Immunization is preferred during the 27th 36th week of gestation. An adult who has not previously received Tdap or who does not know her vaccine status should receive 1 dose of Tdap. This initial dose should be followed by tetanus and diphtheria toxoids (Td) booster doses every 10 years. Adults with an unknown or incomplete history of completing a 3-dose immunization series with Td-containing vaccines should begin or complete a primary immunization series including a Tdap dose. Adults should receive a Td booster every 10 years.  Varicella vaccine. An adult without evidence of immunity to varicella should receive 2 doses or a second dose if she has previously received 1 dose. Pregnant females who do not have evidence of immunity should receive the first dose after pregnancy. This first dose should be obtained before leaving the health care facility. The second dose should be obtained 4 8 weeks after the first dose.  Human papillomavirus (HPV) vaccine. Females aged 13 26  years who have not received the vaccine previously should obtain the 3-dose series. The vaccine is not recommended for use in pregnant females. However, pregnancy testing is not needed before receiving a dose. If a female is found to be pregnant after receiving a dose, no treatment is needed. In that case, the remaining doses should be delayed until after the pregnancy. Immunization is recommended for any person with an immunocompromised condition through the age of 26 years if she did not get any or all doses earlier. During the 3-dose series, the second dose should be obtained 4 8 weeks after the first dose. The third dose should be obtained 24 weeks after the first dose and 16 weeks after the second dose.  Zoster vaccine. One dose is recommended for adults aged 60 years or older unless certain conditions are present.  Measles, mumps, and rubella (MMR) vaccine. Adults born before 1957 generally are considered immune to measles and mumps. Adults born in 1957 or later should have 1 or more doses of MMR vaccine unless there is a contraindication to the vaccine or there is laboratory evidence of immunity to each of the three diseases. A routine second dose of MMR vaccine should be obtained at least 28 days after the first dose for students attending postsecondary schools, health care workers, or international travelers. People who received inactivated measles vaccine or an unknown type of measles vaccine during 1963 1967 should receive 2 doses of MMR vaccine. People who received inactivated mumps vaccine or an unknown type of mumps vaccine before 1979 and are at high risk for mumps infection should consider immunization with 2 doses of   MMR vaccine. For females of childbearing age, rubella immunity should be determined. If there is no evidence of immunity, females who are not pregnant should be vaccinated. If there is no evidence of immunity, females who are pregnant should delay immunization until after pregnancy.  Unvaccinated health care workers born before [birth year] who lack laboratory evidence of measles, mumps, or rubella immunity or laboratory confirmation of disease should consider measles and mumps immunization with 2 doses of MMR vaccine or rubella immunization with 1 dose of MMR vaccine.  Pneumococcal 13-valent conjugate (PCV13) vaccine. When indicated, a person who is uncertain of her immunization history and has no record of immunization should receive the PCV13 vaccine. An adult aged 54 years or older who has certain medical conditions and has not been previously immunized should receive 1 dose of PCV13 vaccine. This PCV13 should be followed with a dose of pneumococcal polysaccharide (PPSV23) vaccine. The PPSV23 vaccine dose should be obtained at least 8 weeks after the dose of PCV13 vaccine. An adult aged 58 years or older who has certain medical conditions and previously received 1 or more doses of PPSV23 vaccine should receive 1 dose of PCV13. The PCV13 vaccine dose should be obtained 1 or more years after the last PPSV23 vaccine dose.  Pneumococcal polysaccharide (PPSV23) vaccine. When PCV13 is also indicated, PCV13 should be obtained first. All adults aged 58 years and older should be immunized. An adult younger than age 65 years who has certain medical conditions should be immunized. Any person who resides in a nursing home or long-term care facility should be immunized. An adult smoker should be immunized. People with an immunocompromised condition and certain other conditions should receive both PCV13 and PPSV23 vaccines. People with human immunodeficiency virus (HIV) infection should be immunized as soon as possible after diagnosis. Immunization during chemotherapy or radiation therapy should be avoided. Routine use of PPSV23 vaccine is not recommended for American Indians, [place] Natives, or people younger than 65 years unless there are medical conditions that require PPSV23 vaccine. When indicated,  people who have unknown immunization and have no record of immunization should receive PPSV23 vaccine. One-time revaccination 5 years after the first dose of PPSV23 is recommended for people aged 70 64 years who have chronic kidney failure, nephrotic syndrome, asplenia, or immunocompromised conditions. People who received 1 2 doses of PPSV23 before age 32 years should receive another dose of PPSV23 vaccine at age 96 years or later if at least 5 years have passed since the previous dose. Doses of PPSV23 are not needed for people immunized with PPSV23 at or after age 55 years.  Meningococcal vaccine. Adults with asplenia or persistent complement component deficiencies should receive 2 doses of quadrivalent meningococcal conjugate (MenACWY-D) vaccine. The doses should be obtained at least 2 months apart. Microbiologists working with certain meningococcal bacteria, [employer] recruits, people at risk during an outbreak, and people who travel to or live in countries with a high rate of meningitis should be immunized. A first-year college student up through age 58 years who is living in a residence hall should receive a dose if she did not receive a dose on or after her 16th birthday. Adults who have certain high-risk conditions should receive one or more doses of vaccine.  Hepatitis A vaccine. Adults who wish to be protected from this disease, have certain high-risk conditions, work with hepatitis A-infected animals, work in hepatitis A research labs, or travel to or work in countries with a high rate of hepatitis A should be  immunized. Adults who were previously unvaccinated and who anticipate close contact with an international adoptee during the first 60 days after arrival in the [place] from a country with a high rate of hepatitis A should be immunized.  Hepatitis B vaccine.  Adults who wish to be protected from this disease, have certain high-risk conditions, may be exposed to blood or other infectious  body fluids, are household contacts or sex partners of hepatitis B positive people, are clients or workers in certain care facilities, or travel to or work in countries with a high rate of hepatitis B should be immunized.  Haemophilus influenzae type b (Hib) vaccine. A previously unvaccinated person with asplenia or sickle cell disease or having a scheduled splenectomy should receive 1 dose of Hib vaccine. Regardless of previous immunization, a recipient of a hematopoietic stem cell transplant should receive a 3-dose series 6 12 months after her successful transplant. Hib vaccine is not recommended for adults with HIV infection.  Preventive Services / Frequency Ages 6 to 39years  Blood pressure check.** / Every 1 to 2 years.  Lipid and cholesterol check.** / Every 5 years beginning at age 39.  Clinical breast exam.** / Every 3 years for women in their 61s and 62s.  BRCA-related cancer risk assessment.** / For women who have family members with a BRCA-related cancer (breast, ovarian, tubal, or peritoneal cancers).  Pap test.** / Every 2 years from ages 47 through 85. Every 3 years starting at age 34 through age 12 or 74 with a history of 3 consecutive normal Pap tests.  HPV screening.** / Every 3 years from ages 46 through ages 43 to 54 with a history of 3 consecutive normal Pap tests.  Hepatitis C blood test.** / For any individual with known risks for hepatitis C.  Skin self-exam. / Monthly.  Influenza vaccine. / Every year.  Tetanus, diphtheria, and acellular pertussis (Tdap, Td) vaccine.** / Consult your health care provider. Pregnant women should receive 1 dose of Tdap vaccine during each pregnancy. 1 dose of Td every 10 years.  Varicella vaccine.** / Consult your health care provider. Pregnant females who do not have evidence of immunity should receive the first dose after pregnancy.  HPV vaccine. / 3 doses over 6 months, if 64 and younger. The vaccine is not recommended for use in  pregnant females. However, pregnancy testing is not needed before receiving a dose.  Measles, mumps, rubella (MMR) vaccine.** / You need at least 1 dose of MMR if you were born in 1957 or later. You may also need a 2nd dose. For females of childbearing age, rubella immunity should be determined. If there is no evidence of immunity, females who are not pregnant should be vaccinated. If there is no evidence of immunity, females who are pregnant should delay immunization until after pregnancy.  Pneumococcal 13-valent conjugate (PCV13) vaccine.** / Consult your health care provider.  Pneumococcal polysaccharide (PPSV23) vaccine.** / 1 to 2 doses if you smoke cigarettes or if you have certain conditions.  Meningococcal vaccine.** / 1 dose if you are age 71 to 35 years and a Market researcher living in a residence hall, or have one of several medical conditions, you need to get vaccinated against meningococcal disease. You may also need additional booster doses.  Hepatitis A vaccine.** / Consult your health care provider.  Hepatitis B vaccine.** / Consult your health care provider.  Haemophilus influenzae type b (Hib) vaccine.** / Consult your health care provider.  Ages 55 to 64years  Blood pressure check.** / Every 1 to 2 years.  Lipid and cholesterol check.** / Every 5 years beginning at age 20 years.  Lung cancer screening. / Every year if you are aged 55 80 years and have a 30-pack-year history of smoking and currently smoke or have quit within the past 15 years. Yearly screening is stopped once you have quit smoking for at least 15 years or develop a health problem that would prevent you from having lung cancer treatment.  Clinical breast exam.** / Every year after age 40 years.  BRCA-related cancer risk assessment.** / For women who have family members with a BRCA-related cancer (breast, ovarian, tubal, or peritoneal cancers).  Mammogram.** / Every year beginning at age 40  years and continuing for as long as you are in good health. Consult with your health care provider.  Pap test.** / Every 3 years starting at age 30 years through age 65 or 70 years with a history of 3 consecutive normal Pap tests.  HPV screening.** / Every 3 years from ages 30 years through ages 65 to 70 years with a history of 3 consecutive normal Pap tests.  Fecal occult blood test (FOBT) of stool. / Every year beginning at age 50 years and continuing until age 75 years. You may not need to do this test if you get a colonoscopy every 10 years.  Flexible sigmoidoscopy or colonoscopy.** / Every 5 years for a flexible sigmoidoscopy or every 10 years for a colonoscopy beginning at age 50 years and continuing until age 75 years.  Hepatitis C blood test.** / For all people born from 1945 through 1965 and any individual with known risks for hepatitis C.  Skin self-exam. / Monthly.  Influenza vaccine. / Every year.  Tetanus, diphtheria, and acellular pertussis (Tdap/Td) vaccine.** / Consult your health care provider. Pregnant women should receive 1 dose of Tdap vaccine during each pregnancy. 1 dose of Td every 10 years.  Varicella vaccine.** / Consult your health care provider. Pregnant females who do not have evidence of immunity should receive the first dose after pregnancy.  Zoster vaccine.** / 1 dose for adults aged 60 years or older.  Measles, mumps, rubella (MMR) vaccine.** / You need at least 1 dose of MMR if you were born in 1957 or later. You may also need a 2nd dose. For females of childbearing age, rubella immunity should be determined. If there is no evidence of immunity, females who are not pregnant should be vaccinated. If there is no evidence of immunity, females who are pregnant should delay immunization until after pregnancy.  Pneumococcal 13-valent conjugate (PCV13) vaccine.** / Consult your health care provider.  Pneumococcal polysaccharide (PPSV23) vaccine.** / 1 to 2 doses if  you smoke cigarettes or if you have certain conditions.  Meningococcal vaccine.** / Consult your health care provider.  Hepatitis A vaccine.** / Consult your health care provider.  Hepatitis B vaccine.** / Consult your health care provider.  Haemophilus influenzae type b (Hib) vaccine.** / Consult your health care provider.  Ages 65 years and over  Blood pressure check.** / Every 1 to 2 years.  Lipid and cholesterol check.** / Every 5 years beginning at age 20 years.  Lung cancer screening. / Every year if you are aged 55 80 years and have a 30-pack-year history of smoking and currently smoke or have quit within the past 15 years. Yearly screening is stopped once you have quit smoking for at least 15 years or develop a health problem that   would prevent you from having lung cancer treatment.  Clinical breast exam.** / Every year after age 103 years.  BRCA-related cancer risk assessment.** / For women who have family members with a BRCA-related cancer (breast, ovarian, tubal, or peritoneal cancers).  Mammogram.** / Every year beginning at age 36 years and continuing for as long as you are in good health. Consult with your health care provider.  Pap test.** / Every 3 years starting at age 5 years through age 85 or 10 years with 3 consecutive normal Pap tests. Testing can be stopped between 65 and 70 years with 3 consecutive normal Pap tests and no abnormal Pap or HPV tests in the past 10 years.  HPV screening.** / Every 3 years from ages 93 years through ages 70 or 45 years with a history of 3 consecutive normal Pap tests. Testing can be stopped between 65 and 70 years with 3 consecutive normal Pap tests and no abnormal Pap or HPV tests in the past 10 years.  Fecal occult blood test (FOBT) of stool. / Every year beginning at age 8 years and continuing until age 45 years. You may not need to do this test if you get a colonoscopy every 10 years.  Flexible sigmoidoscopy or colonoscopy.** /  Every 5 years for a flexible sigmoidoscopy or every 10 years for a colonoscopy beginning at age 69 years and continuing until age 68 years.  Hepatitis C blood test.** / For all people born from [birth year] through 1967 and any individual with known risks for hepatitis C.  Osteoporosis screening.** / A one-time screening for women ages 7 years and over and women at risk for fractures or osteoporosis.  Skin self-exam. / Monthly.  Influenza vaccine. / Every year.  Tetanus, diphtheria, and acellular pertussis (Tdap/Td) vaccine.** / 1 dose of Td every 10 years.  Varicella vaccine.** / Consult your health care provider.  Zoster vaccine.** / 1 dose for adults aged 5 years or older.  Pneumococcal 13-valent conjugate (PCV13) vaccine.** / Consult your health care provider.  Pneumococcal polysaccharide (PPSV23) vaccine.** / 1 dose for all adults aged 74 years and older.  Meningococcal vaccine.** / Consult your health care provider.  Hepatitis A vaccine.** / Consult your health care provider.  Hepatitis B vaccine.** / Consult your health care provider.  Haemophilus influenzae type b (Hib) vaccine.** / Consult your health care provider. ** Family history and personal history of risk and conditions may change your health care provider's recommendations. Document Released: 11/02/2001 Document Revised: 06/27/2013  [facility] Patient Information 2014 [place], [place].   EXERCISE AND DIET:  We recommended that you start or continue a regular exercise program for good health. Regular exercise means any activity that makes your heart beat faster and makes you sweat.  We recommend exercising at least 30 minutes per day at least 3 days a week, preferably 5.  We also recommend a diet low in fat and sugar / carbohydrates.  Inactivity, poor dietary choices and obesity can cause diabetes, heart attack, stroke, and kidney damage, among others.     ALCOHOL AND SMOKING:  Women should limit their alcohol intake to no  more than 7 drinks/beers/glasses of wine (combined, not each!) per week. Moderation of alcohol intake to this level decreases your risk of breast cancer and liver damage.  ( And of course, no recreational drugs are part of a healthy lifestyle.)  Also, you should not be smoking at all or even being exposed to second hand smoke. Most people know smoking can  cause cancer, and various heart and lung diseases, but did you know it also contributes to weakening of your bones?  Aging of your skin?  Yellowing of your teeth and nails?   CALCIUM AND VITAMIN D:  Adequate intake of calcium and Vitamin D are recommended.  The recommendations for exact amounts of these supplements seem to change often, but generally speaking 600 mg of calcium (either carbonate or citrate) and 800 units of Vitamin D per day seems prudent. Certain women may benefit from higher intake of Vitamin D.  If you are among these women, your doctor will have told you during your visit.     PAP SMEARS:  Pap smears, to check for cervical cancer or precancers,  have traditionally been done yearly, although recent scientific advances have shown that most women can have pap smears less often.  However, every woman still should have a physical exam from her gynecologist or primary care physician every year. It will include a breast check, inspection of the vulva and vagina to check for abnormal growths or skin changes, a visual exam of the cervix, and then an exam to evaluate the size and shape of the uterus and ovaries.  And after 59 years of age, a rectal exam is indicated to check for rectal cancers. We will also provide age appropriate advice regarding health maintenance, like when you should have certain vaccines, screening for sexually transmitted diseases, bone density testing, colonoscopy, mammograms, etc.    MAMMOGRAMS:  All women over 65 years old should have a yearly mammogram. Many facilities now offer a "3D" mammogram, which may cost  around $50 extra out of pocket. If possible,  we recommend you accept the option to have the 3D mammogram performed.  It both reduces the number of women who will be called back for extra views which then turn out to be normal, and it is better than the routine mammogram at detecting truly abnormal areas.     COLONOSCOPY:  Colonoscopy to screen for colon cancer is recommended for all women at age 26.  We know, you hate the idea of the prep.  We agree, BUT, having colon cancer and not knowing it is worse!!  Colon cancer so often starts as a polyp that can be seen and removed at colonscopy, which can quite literally save your life!  And if your first colonoscopy is normal and you have no family history of colon cancer, most women don't have to have it again for 10 years.  Once every ten years, you can do something that may end up saving your life, right?  We will be happy to help you get it scheduled when you are ready.  Be sure to check your insurance coverage so you understand how much it will cost.  It may be covered as a preventative service at no cost, but you should check your particular policy.    Mediterranean Diet A Mediterranean diet refers to food and lifestyle choices that are based on the traditions of countries located on the [employer]. This way of eating has been shown to help prevent certain conditions and improve outcomes for people who have chronic diseases, like kidney disease and heart disease. What are tips for following this plan? Lifestyle  Cook and eat meals together with your family, when possible.  Drink enough fluid to keep your urine clear or pale yellow.  Be physically active every day. This includes: ? Aerobic exercise like running or swimming. ? Leisure activities like  gardening, walking, or housework.  Get 7-8 hours of sleep each night.  If recommended by your health care provider, drink red wine in moderation. This means 1 glass a day for nonpregnant women  and 2 glasses a day for men. A glass of wine equals 5 oz (150 mL). Reading food labels  Check the serving size of packaged foods. For foods such as rice and pasta, the serving size refers to the amount of cooked product, not dry.  Check the total fat in packaged foods. Avoid foods that have saturated fat or trans fats.  Check the ingredients list for added sugars, such as corn syrup. Shopping  At the grocery store, buy most of your food from the areas near the walls of the store. This includes: ? Fresh fruits and vegetables (produce). ? Grains, beans, nuts, and seeds. Some of these may be available in unpackaged forms or large amounts (in bulk). ? Fresh seafood. ? Poultry and eggs. ? Low-fat dairy products.  Buy whole ingredients instead of prepackaged foods.  Buy fresh fruits and vegetables in-season from local farmers markets.  Buy frozen fruits and vegetables in resealable bags.  If you do not have access to quality fresh seafood, buy precooked frozen shrimp or canned fish, such as tuna, salmon, or sardines.  Buy small amounts of raw or cooked vegetables, salads, or olives from the deli or salad bar at your store.  Stock your pantry so you always have certain foods on hand, such as olive oil, canned tuna, canned tomatoes, rice, pasta, and beans. Cooking  Cook foods with extra-virgin olive oil instead of using butter or other vegetable oils.  Have meat as a side dish, and have vegetables or grains as your main dish. This means having meat in small portions or adding small amounts of meat to foods like pasta or stew.  Use beans or vegetables instead of meat in common dishes like chili or lasagna.  Experiment with different cooking methods. Try roasting or broiling vegetables instead of steaming or sauteing them.  Add frozen vegetables to soups, stews, pasta, or rice.  Add nuts or seeds for added healthy fat at each meal. You can add these to yogurt, salads, or vegetable  dishes.  Marinate fish or vegetables using olive oil, lemon juice, garlic, and fresh herbs. Meal planning  Plan to eat 1 vegetarian meal one day each week. Try to work up to 2 vegetarian meals, if possible.  Eat seafood 2 or more times a week.  Have healthy snacks readily available, such as: ? Vegetable sticks with hummus. ? [place] yogurt. ? Fruit and nut trail mix.  Eat balanced meals throughout the week. This includes: ? Fruit: 2-3 servings a day ? Vegetables: 4-5 servings a day ? Low-fat dairy: 2 servings a day ? Fish, poultry, or lean meat: 1 serving a day ? Beans and legumes: 2 or more servings a week ? Nuts and seeds: 1-2 servings a day ? Whole grains: 6-8 servings a day ? Extra-virgin olive oil: 3-4 servings a day  Limit red meat and sweets to only a few servings a month What are my food choices?  Mediterranean diet ? Recommended ? Grains: Whole-grain pasta. Brown rice. Bulgar wheat. Polenta. Couscous. Whole-wheat bread. [place]. ? Vegetables: Artichokes. Beets. Broccoli. Cabbage. Carrots. Eggplant. Green beans. Chard. Kale. Spinach. Onions. Leeks. Peas. Squash. Tomatoes. Peppers. Radishes. ? Fruits: Apples. Apricots. Avocado. Berries. Bananas. Cherries. Dates. Figs. Grapes. Lemons. Melon. Oranges. Peaches. Plums. Pomegranate. ? Meats and other protein  foods: Beans. Almonds. Sunflower seeds. Pine nuts. Peanuts. [place]. Salmon. Scallops. Shrimp. [place]. Tilapia. Clams. Oysters. Eggs. ? Dairy: Low-fat milk. Cheese. Greek yogurt. ? Beverages: Water. Red wine. Herbal tea. ? Fats and oils: Extra virgin olive oil. Avocado oil. Grape seed oil. ? Sweets and desserts: [place] yogurt with honey. Baked apples. Poached pears. Trail mix. ? Seasoning and other foods: Basil. Cilantro. Coriander. Cumin. Mint. Parsley. Sage. Rosemary. Tarragon. Garlic. Oregano. Thyme. Pepper. Balsalmic vinegar. Tahini. Hummus. Tomato sauce. Olives. Mushrooms. ? Limit these ? Grains: Prepackaged pasta or  rice dishes. Prepackaged cereal with added sugar. ? Vegetables: Deep fried potatoes (french fries). ? Fruits: Fruit canned in syrup. ? Meats and other protein foods: Beef. Pork. Lamb. Poultry with skin. Hot dogs. [place] Salines. ? Dairy: Ice cream. Sour cream. Whole milk. ? Beverages: Juice. Sugar-sweetened soft drinks. Beer. Liquor and spirits. ? Fats and oils: Butter. Canola oil. Vegetable oil. Beef fat (tallow). Lard. ? Sweets and desserts: Cookies. Cakes. Pies. Candy. ? Seasoning and other foods: Mayonnaise. Premade sauces and marinades. ? The items listed may not be a complete list. Talk with your dietitian about what dietary choices are right for you. Summary  The Mediterranean diet includes both food and lifestyle choices.  Eat a variety of fresh fruits and vegetables, beans, nuts, seeds, and whole grains.  Limit the amount of red meat and sweets that you eat.  Talk with your health care provider about whether it is safe for you to drink red wine in moderation. This means 1 glass a day for nonpregnant women and 2 glasses a day for men. A glass of wine equals 5 oz (150 mL). This information is not intended to replace advice given to you by your health care provider. Make sure you discuss any questions you have with your health care provider. Document Released: 04/29/2016 Document Revised: 06/01/2016 Document Reviewed: 04/29/2016 Elsevier Interactive Patient Education  2018 [employer].  Increase water intake, strive for at least 65 ounces/day.   Follow Mediterranean diet Increase regular exercise.  Recommend at least 30 minutes daily, 5 days per week of walking, jogging, biking, swimming, YouTube/Pinterest workout videos. We will call you when lab results are available. Recommend follow-up in 6 months for office visit and repeat fasting labs. NICE TO SEE YOU!

## 2018-06-12 NOTE — Assessment & Plan Note (Signed)
Slightly elevated EOS, CBC repeated today She denies personal/famkily hx of autoimmune disorders

## 2018-06-12 NOTE — Assessment & Plan Note (Signed)
Her baseline pressure readiings are SBP 90s DBP 60s She denies dizziness/fatigue/confusion

## 2018-06-13 LAB — CBC WITH DIFFERENTIAL/PLATELET
BASOS: 1 %
Basophils Absolute: 0.1 10*3/uL (ref 0.0–0.2)
EOS (ABSOLUTE): 0.5 10*3/uL — ABNORMAL HIGH (ref 0.0–0.4)
EOS: 7 %
HEMATOCRIT: 43.6 % (ref 34.0–46.6)
Hemoglobin: 14.5 g/dL (ref 11.1–15.9)
Immature Grans (Abs): 0 10*3/uL (ref 0.0–0.1)
Immature Granulocytes: 0 %
LYMPHS ABS: 2.6 10*3/uL (ref 0.7–3.1)
Lymphs: 38 %
MCH: 31 pg (ref 26.6–33.0)
MCHC: 33.3 g/dL (ref 31.5–35.7)
MCV: 93 fL (ref 79–97)
MONOS ABS: 0.5 10*3/uL (ref 0.1–0.9)
Monocytes: 7 %
NEUTROS ABS: 3.3 10*3/uL (ref 1.4–7.0)
Neutrophils: 47 %
Platelets: 320 10*3/uL (ref 150–450)
RBC: 4.67 x10E6/uL (ref 3.77–5.28)
RDW: 13.5 % (ref 12.3–15.4)
WBC: 6.9 10*3/uL (ref 3.4–10.8)

## 2018-06-19 ENCOUNTER — Ambulatory Visit
Admission: RE | Admit: 2018-06-19 | Discharge: 2018-06-19 | Disposition: A | Discharge status: Home / Self Care | Payer: 59 | Source: Ambulatory Visit | Attending: Adult Health | Admitting: Adult Health

## 2018-06-19 DIAGNOSIS — Z0000 Encounter for general adult medical examination without abnormal findings: Secondary | ICD-10-CM

## 2018-06-19 DIAGNOSIS — Z87891 Personal history of nicotine dependence: Secondary | ICD-10-CM

## 2018-07-25 NOTE — Progress Notes (Signed)
Received fax from [employer] that pt has not returned Cologuard kit.  Letter mailed to pt to complete samples and return to lab.  Charyl Bigger, CMA

## 2018-10-10 NOTE — Progress Notes (Signed)
Subjective:    Patient ID: Barbara [name], female    DOB: Dec 07, 1960, 58 y.o.   MRN: [id number]  HPI:  Barbara [name] presents with rash on her neck that developed 6 weeks ago. Rash itches and burns and is localized to left anterior neck and left anterior chest. She reports that her mother has eczema. She has been using OTC Cortisone cream with some sx relief. She denies drainage from area She denies fever/night sweats/chills/N/V/D She denies recent travel outside [place] She also reports increase in stress/depression r/t husband's alcoholism He has been heavy drinker >10 years When they met initially met they both enjoyed social drinking that eventually escalated into excessive drinking. She stopped all alcohol use 2 years ago and he claimed to as well, however in reality he "closet drinking" She will find boxes of empty wine all over home He will be intoxicated at all times of the day He is not missing work or DUIs He ETOH use is triggering constant arguments and marital discourse She is currently in therapy, weekly appts-great! She is not using ETOH She denies thoughts of harming herself/others Since they recently moved to area, she does not have many local friends She does have one work Social worker in similar situation that she can lean for emotional support. She was on Citalopram in past, tolerated well She also reports insomnia r/t anxiety and "mind racing"   Patient Care Team    Relationship Specialty Notifications Start End  Barbara Grandchild, NP PCP - General Family Medicine  04/17/18     Patient Active Problem List   Diagnosis Date Noted  . Depression, major, single episode, moderate ([place]) 10/11/2018  . Eczema 10/11/2018  . Insomnia 10/11/2018  . Abnormal CBC 06/12/2018  . Low blood pressure reading 06/12/2018  . Healthcare maintenance 05/18/2018  . Screening for breast cancer 05/18/2018  . Axillary pain, right 05/18/2018     History reviewed. No pertinent past medical  history.   Past Surgical History:  Procedure Laterality Date  . [place]  . TUBAL LIGATION       Family History  Problem Relation Age of Onset  . Heart attack Father   . Diabetes Sister   . Heart attack Brother   . Diabetes Brother   . Heart attack Maternal Grandfather   . Diabetes Maternal Grandfather   . Cancer Paternal Grandmother 40       breast  . Breast cancer Paternal Grandmother   . Heart attack Paternal Grandfather      Social History   Substance and Sexual Activity  Drug Use Never     Social History   Substance and Sexual Activity  Alcohol Use Never  . Frequency: Never     Social History   Tobacco Use  Smoking Status Former Smoker  . Packs/day: 0.75  . Years: 30.00  . Pack years: 22.50  . Types: Cigarettes  . Last attempt to quit: 09/21/2011  . Years since quitting: 7.0  Smokeless Tobacco Never Used     Outpatient Encounter Medications as of 10/11/2018  Medication Sig  . citalopram (CELEXA) 20 MG tablet 1/2 tablet by mouth daily for one week then one tablet daily- hold at this dose  . traZODone (DESYREL) 50 MG tablet Take 0.5-1 tablets (25-50 mg total) by mouth at bedtime as needed for sleep.  Marland Kitchen triamcinolone cream (KENALOG) 0.1 % Apply 1 application topically 2 (two) times daily.   No facility-administered encounter medications on file as of  10/11/2018.     Allergies: Patient has no known allergies.  Body mass index is 24.38 kg/m.  Blood pressure 108/63, pulse 79, temperature 97.9 F (36.6 C), temperature source Oral, height 5' 3.5" (1.613 m), weight 139 lb 12.8 oz (63.4 kg), SpO2 99 %.    Review of Systems  Constitutional: Positive for fatigue. Negative for activity change, appetite change, chills, diaphoresis, fever and unexpected weight change.  Respiratory: Negative for cough, chest tightness, shortness of breath, wheezing and stridor.   Cardiovascular: Negative for chest pain, palpitations and leg  swelling.  Gastrointestinal: Negative for constipation, diarrhea, nausea and vomiting.  Skin: Positive for color change and rash.  Neurological: Negative for dizziness and headaches.  Hematological: Does not bruise/bleed easily.  Psychiatric/Behavioral: Positive for dysphoric mood and sleep disturbance. Negative for behavioral problems, confusion, decreased concentration, hallucinations, self-injury and suicidal ideas. The patient is nervous/anxious. The patient is not hyperactive.        Objective:   Physical Exam Constitutional:      General: She is in acute distress.     Appearance: She is not ill-appearing, toxic-appearing or diaphoretic.  Eyes:     Conjunctiva/sclera: Conjunctivae normal.     Pupils: Pupils are equal, round, and reactive to light.  Neck:     Musculoskeletal: Normal range of motion.  Cardiovascular:     Rate and Rhythm: Normal rate.     Pulses: Normal pulses.     Heart sounds: Normal heart sounds. No murmur. No friction rub. No gallop.   Pulmonary:     Effort: Pulmonary effort is normal. No respiratory distress.     Breath sounds: No stridor. No wheezing, rhonchi or rales.  Chest:     Chest wall: No tenderness.  Lymphadenopathy:     Cervical: No cervical adenopathy.  Skin:    Capillary Refill: Capillary refill takes less than 2 seconds.     Findings: Rash present.     Comments: Reddened, flaky skin noted on L anterior neck/chest No open tissue/drainage noted   Neurological:     Mental Status: She is alert and oriented to person, place, and time.  Psychiatric:        Attention and Perception: Attention and perception normal.        Mood and Affect: Mood normal. Affect is tearful.        Behavior: Behavior normal.        Thought Content: Thought content normal.        Judgment: Judgment normal.     Comments: Very well groomed Tearful at times           Assessment & Plan:   1. Depression, major, single episode, moderate ([place])   2. Insomnia,  unspecified type   3. Eczema, unspecified type     Eczema Mother has hx of eczema  Provided Triamcinolone 0.1% cream BID  Depression, major, single episode, moderate ([place]) Seeing therapist weekly- GREAT! Previously on Citalopram, tolerated well Please use the cream as directed. Please start Citalopram 62m- 1/2 tablet daily for one week then increase to one tablet by mouth daily- hold at this dose. Please use Trazodone 1/2- 1 tablet at night as needed for insomnia. Increase water intake and eat a heart healthy diet. Continue with therapist as directed. Please follow-up in 3-4 weeks.  [place] noted Please use Trazodone 1/2- 1 tablet at night as needed for insomnia.     FOLLOW-UP:  Return in about 4 weeks (around 11/08/2018)  for Evaluate Medication Effectiveness, Depression, Insomnia.

## 2018-10-11 ENCOUNTER — Encounter: Payer: Self-pay | Admitting: Adult Health

## 2018-10-11 ENCOUNTER — Ambulatory Visit: Payer: 59 | Admitting: Adult Health

## 2018-10-11 VITALS — BP 108/63 | HR 79 | Temp 97.9°F | Ht 63.5 in | Wt 139.8 lb

## 2018-10-11 DIAGNOSIS — L309 Dermatitis, unspecified: Secondary | ICD-10-CM

## 2018-10-11 DIAGNOSIS — G4700 Insomnia, unspecified: Secondary | ICD-10-CM

## 2018-10-11 DIAGNOSIS — F321 Major depressive disorder, single episode, moderate: Secondary | ICD-10-CM | POA: Diagnosis not present

## 2018-10-11 MED ORDER — TRIAMCINOLONE ACETONIDE 0.1 % EX CREA: 1.0000 "application " | TOPICAL_CREAM | Freq: Two times a day (BID) | CUTANEOUS | 0 refills | Status: DC

## 2018-10-11 MED ORDER — TRAZODONE HCL 50 MG PO TABS: 25.0000 mg | ORAL_TABLET | Freq: Every evening | ORAL | 0 refills | Status: DC | PRN

## 2018-10-11 MED ORDER — CITALOPRAM HYDROBROMIDE 20 MG PO TABS: ORAL_TABLET | ORAL | 3 refills | Status: DC

## 2018-10-11 NOTE — Assessment & Plan Note (Signed)
[place] Controlled Substance Database reviewed- no aberrancies noted Please use Trazodone 1/2- 1 tablet at night as needed for insomnia.

## 2018-10-11 NOTE — Assessment & Plan Note (Signed)
Seeing therapist weekly- GREAT! Previously on Citalopram, tolerated well Please use the cream as directed. Please start Citalopram 20mg - 1/2 tablet daily for one week then increase to one tablet by mouth daily- hold at this dose. Please use Trazodone 1/2- 1 tablet at night as needed for insomnia. Increase water intake and eat a heart healthy diet. Continue with therapist as directed. Please follow-up in 3-4 weeks.

## 2018-10-11 NOTE — Assessment & Plan Note (Signed)
Mother has hx of eczema  Provided Triamcinolone 0.1% cream BID

## 2018-10-11 NOTE — Patient Instructions (Signed)
Eczema Eczema is a broad term for a group of skin conditions that cause skin to become rough and inflamed. Each type of eczema has different triggers, symptoms, and treatments. Eczema of any type is usually itchy and symptoms range from mild to severe. Eczema and its symptoms are not spread from person to person (are not contagious). It can appear on different parts of the body at different times. Your eczema may not look the same as someone else's eczema. What are the types of eczema? Atopic dermatitis This is a long-term (chronic) skin disease that keeps coming back (recurring). Usual symptoms are dry skin and small, solid pimples that may swell and leak fluid (weep). Contact dermatitis  This happens when something irritates the skin and causes a rash. The irritation can come from substances that you are allergic to (allergens), such as poison ivy, chemicals, or medicines that were applied to your skin. Dyshidrotic eczema This is a form of eczema on the hands and feet. It shows up as very itchy, fluid-filled blisters. It can affect people of any age, but is more common before age 40. Hand eczema  This causes very itchy areas of skin on the palms and sides of the hands and fingers. This type of eczema is common in industrial jobs where you may be exposed to many different types of irritants. Lichen simplex chronicus This type of eczema occurs when a person constantly scratches one area of the body. Repeated scratching of the area leads to thickened skin (lichenification). Lichen simplex chronicus can occur along with other types of eczema. It is more common in adults, but may be seen in children as well. Nummular eczema This is a common type of eczema. It has no known cause. It typically causes a red, circular, crusty lesion (plaque) that may be itchy. Scratching may become a habit and can cause bleeding. Nummular eczema occurs most often in people of middle-age or older. It most often affects the  hands. Seborrheic dermatitis This is a common skin disease that mainly affects the scalp. It may also affect any oily areas of the body, such as the face, sides of nose, eyebrows, ears, eyelids, and chest. It is marked by small scaling and redness of the skin (erythema). This can affect people of all ages. In infants, this condition is known as "cradle cap." Stasis dermatitis This is a common skin disease that usually appears on the legs and feet. It most often occurs in people who have a condition that prevents blood from being pumped through the veins in the legs (chronic venous insufficiency). Stasis dermatitis is a chronic condition that needs long-term management. How is eczema diagnosed? Your health care provider will examine your skin and review your medical history. He or she may also give you skin patch tests. These tests involve taking patches that contain possible allergens and placing them on your back. He or she will then check in a few days to see if an allergic reaction occurred. What are the common treatments? Treatment for eczema is based on the type of eczema you have. Hydrocortisone steroid medicine can relieve itching quickly and help reduce inflammation. This medicine may be prescribed or obtained over-the-counter, depending on the strength of the medicine that is needed. Follow these instructions at home:  Take over-the-counter and prescription medicines only as told by your health care provider.  Use creams or ointments to moisturize your skin. Do not use lotions.  Learn what triggers or irritates your symptoms. Avoid these things.    Treat symptom flare-ups quickly.  Do not itch your skin. This can make your rash worse.  Keep all follow-up visits as told by your health care provider. This is important. Where to find more information  The American Academy of Dermatology: http://jones-macias.info/  The National Eczema Association: www.nationaleczema.org Contact a health care provider  if:  You have serious itching, even with treatment.  You regularly scratch your skin until it bleeds.  Your rash looks different than usual.  Your skin is painful, swollen, or more red than usual.  You have a fever. Summary  There are eight general types of eczema. Each type has different triggers.  Eczema of any type causes itching that may range from mild to severe.  Treatment varies based on the type of eczema you have. Hydrocortisone steroid medicine can help with itching and inflammation.  Protecting your skin is the best way to prevent eczema. Use moisturizers and lotions. Avoid triggers and irritants, and treat flare-ups quickly. This information is not intended to replace advice given to you by your health care provider. Make sure you discuss any questions you have with your health care provider. Document Released: 01/20/2017 Document Revised: 01/20/2017 Document Reviewed: 01/20/2017 Elsevier Interactive Patient Education  2019 [employer] Inc.   Persistent Depressive Disorder  Persistent depressive disorder (PDD) is a mental health condition. PDD causes symptoms of low-level depression for 2 years or longer. It may also be called long-term (chronic) depression or dysthymia. PDD may include episodes of more severe depression that last for about 2 weeks (major depressive disorder or MDD). PDD can affect the way you think, feel, and sleep. This condition may also affect your relationships. You may be more likely to get sick if you have PDD. Symptoms of PDD occur for most of the day and may include:  Feeling tired (fatigue).  Low energy.  Eating too much or too little.  Sleeping too much or too little.  Feeling restless or agitated.  Feeling hopeless.  Feeling worthless or guilty.  Feeling worried or nervous (anxiety).  Trouble concentrating or making decisions.  Low self-esteem.  A negative way of looking at things (outlook).  Not being able to have fun or  feel pleasure.  Avoiding interacting with people.  Getting angry or annoyed easily (irritability).  Acting aggressive or angry. Follow these instructions at home: Activity  Go back to your normal activities as told by your doctor.  Exercise regularly as told by your doctor. General instructions  Take over-the-counter and prescription medicines only as told by your doctor.  Do not drink alcohol. Or, limit how much alcohol you drink to no more than 1 drink a day for nonpregnant women and 2 drinks a day for men. One drink equals 12 oz of beer, 5 oz of wine, or 1 oz of hard liquor. Alcohol can affect any antidepressant medicines you are taking. Talk with your doctor about your alcohol use.  Eat a healthy diet and get plenty of sleep.  Find activities that you enjoy each day.  Consider joining a support group. Your doctor may be able to suggest a support group.  Keep all follow-up visits as told by your doctor. This is important. Where to find more information [employer] on Mental Illness  www.nami.org U.S. National Institute of Mental Health  https://carter.com/ National Suicide Prevention Lifeline  (330) 711-4098).  This is free, 24-hour help. Contact a doctor if:  Your symptoms get worse.  You have new symptoms.  You have trouble sleeping or doing your  daily activities. Get help right away if:  You self-harm.  You have serious thoughts about hurting yourself or others.  You see, hear, taste, smell, or feel things that are not there (hallucinate). This information is not intended to replace advice given to you by your health care provider. Make sure you discuss any questions you have with your health care provider. Document Released: 08/18/2015 Document Revised: 04/30/2016 Document Reviewed: 04/30/2016 Elsevier Interactive Patient Education  2019 [employer].  Please use the cream as directed. Please start Citalopram 20mg - 1/2 tablet daily for one week  then increase to one tablet by mouth daily- hold at this dose. Please use Trazodone 1/2- 1 tablet at night as needed for insomnia. Increase water intake and eat a heart healthy diet. Continue with therapist as directed. Please follow-up in 3-4 weeks. Keep taking care of yourself!

## 2018-11-08 ENCOUNTER — Ambulatory Visit: Payer: 59 | Admitting: Adult Health

## 2018-11-08 ENCOUNTER — Encounter: Payer: Self-pay | Admitting: Adult Health

## 2018-11-08 VITALS — BP 99/51 | HR 77 | Temp 98.6°F | Ht 63.5 in | Wt 141.0 lb

## 2018-11-08 DIAGNOSIS — F321 Major depressive disorder, single episode, moderate: Secondary | ICD-10-CM

## 2018-11-08 DIAGNOSIS — F411 Generalized anxiety disorder: Secondary | ICD-10-CM | POA: Diagnosis not present

## 2018-11-08 MED ORDER — CITALOPRAM HYDROBROMIDE 10 MG PO TABS: ORAL_TABLET | ORAL | 2 refills | Status: DC

## 2018-11-08 NOTE — Assessment & Plan Note (Signed)
Remain on Citalopram 10mg  once daily. Remain well hydrated, eat a well balanced diet, and continue to walk daily. Continue with therapist as directed. CONGRATULATIONS ON YOUR NEW JOB!!! Follow-up in 3 months, sooner if needed.

## 2018-11-08 NOTE — Patient Instructions (Addendum)
Persistent Depressive Disorder  Persistent depressive disorder (PDD) is a mental health condition. PDD causes symptoms of low-level depression for 2 years or longer. It may also be called long-term (chronic) depression or dysthymia. PDD may include episodes of more severe depression that last for about 2 weeks (major depressive disorder or MDD). PDD can affect the way you think, feel, and sleep. This condition may also affect your relationships. You may be more likely to get sick if you have PDD. Symptoms of PDD occur for most of the day and may include:  Feeling tired (fatigue).  Low energy.  Eating too much or too little.  Sleeping too much or too little.  Feeling restless or agitated.  Feeling hopeless.  Feeling worthless or guilty.  Feeling worried or nervous (anxiety).  Trouble concentrating or making decisions.  Low self-esteem.  A negative way of looking at things (outlook).  Not being able to have fun or feel pleasure.  Avoiding interacting with people.  Getting angry or annoyed easily (irritability).  Acting aggressive or angry. Follow these instructions at home: Activity  Go back to your normal activities as told by your doctor.  Exercise regularly as told by your doctor. General instructions  Take over-the-counter and prescription medicines only as told by your doctor.  Do not drink alcohol. Or, limit how much alcohol you drink to no more than 1 drink a day for nonpregnant women and 2 drinks a day for men. One drink equals 12 oz of beer, 5 oz of wine, or 1 oz of hard liquor. Alcohol can affect any antidepressant medicines you are taking. Talk with your doctor about your alcohol use.  Eat a healthy diet and get plenty of sleep.  Find activities that you enjoy each day.  Consider joining a support group. Your doctor may be able to suggest a support group.  Keep all follow-up visits as told by your doctor. This is important. Where to find more  information [employer] on Mental Illness  www.nami.org U.S. National Institute of Mental Health  https://carter.com/ National Suicide Prevention Lifeline  (475) 407-7437).  This is free, 24-hour help. Contact a doctor if:  Your symptoms get worse.  You have new symptoms.  You have trouble sleeping or doing your daily activities. Get help right away if:  You self-harm.  You have serious thoughts about hurting yourself or others.  You see, hear, taste, smell, or feel things that are not there (hallucinate). This information is not intended to replace advice given to you by your health care provider. Make sure you discuss any questions you have with your health care provider. Document Released: 08/18/2015 Document Revised: 04/30/2016 Document Reviewed: 04/30/2016 Elsevier Interactive Patient Education  2019 [employer] on Citalopram 10mg  once daily. Remain well hydrated, eat a well balanced diet, and continue to walk daily. Continue with therapist as directed. CONGRATULATIONS ON YOUR NEW JOB!!! Follow-up in 3 months, sooner if needed. PROUD OF YOU!

## 2018-11-08 NOTE — Progress Notes (Signed)
Subjective:    Patient ID: Barbara [name], female    DOB: [birth year], 58 y.o.   MRN: [id number] :  10/11/2018 OV: Barbara [name] presents with rash on her neck that developed 6 weeks ago. Rash itches and burns and is localized to left anterior neck and left anterior chest. She reports that her mother has eczema. She has been using OTC Cortisone cream with some sx relief. She denies drainage from area She denies fever/night sweats/chills/N/V/D She denies recent travel outside [place] She also reports increase in stress/depression r/t husband's alcoholism He has been heavy drinker >10 years When they met initially met they both enjoyed social drinking that eventually escalated into excessive drinking. She stopped all alcohol use 2 years ago and he claimed to as well, however in reality he "closet drinking" She will find boxes of empty wine all over home He will be intoxicated at all times of the day He is not missing work or DUIs He ETOH use is triggering constant arguments and marital discourse She is currently in therapy, weekly appts-great! She is not using ETOH She denies thoughts of harming herself/others Since they recently moved to area, she does not have many local friends She does have one work Social worker in similar situation that she can lean for emotional support. She was on Citalopram in past, tolerated well She also reports insomnia r/t anxiety and "mind racing"  11/08/2018 OV: Barbara [name] is here for f/u: Depression and Insomnia She was started on Citalopram one month ago and has remained on 63m QD, she briefly titrated up to 2106mand states "it seems just too much" She reports significant reduction in stress/anxiety. She has been seeing therapist once weekly She denies thoughts of harming herself/others She has starting daily walking regime- great! She continues to abstain from tobacco/vape/ETOH She denies thoughts of harming herself/others She reports talking to her husband  about his drinking and he has noticeably reduced intake, however continues to drink He has had one TeleMedicine appt with therapist He continues to refuse couples therapy She has not used any Trazodone for insomnia, reports improved sleep with increase in exercise and reduction in stress  Patient Care Team    Relationship Specialty Notifications Start End  DaEsaw GrandchildNP PCP - General Family Medicine  04/17/18     Patient Active Problem List   Diagnosis Date Noted  . GAD (generalized anxiety disorder) 11/08/2018  . Depression, major, single episode, moderate ([facility]  . Eczema 10/11/2018  . Insomnia 10/11/2018  . Abnormal CBC 06/12/2018  . Low blood pressure reading 06/12/2018  . Healthcare maintenance 05/18/2018  . Screening for breast cancer 05/18/2018  . Axillary pain, right 05/18/2018     History reviewed. No pertinent past medical history.   Past Surgical History:  Procedure Laterality Date  . [place] TUBAL [place]       Family History  Problem Relation Age of Onset  . Heart attack Father   . Diabetes Sister   . Heart attack Brother   . Diabetes Brother   . Heart attack Maternal Grandfather   . Diabetes Maternal Grandfather   . Cancer Paternal Grandmother [id number]     breast  . Breast cancer Paternal Grandmother   . Heart attack Paternal Grandfather      Social History   Substance and Sexual Activity  Drug Use Never     Social History   Substance and Sexual Activity  Alcohol Use Never  .  Frequency: Never     Social History   Tobacco Use  Smoking Status Former Smoker  . Packs/day: 0.75  . Years: 30.00  . Pack years: 22.50  . Types: Cigarettes  . Last attempt to quit: 09/21/2011  . Years since quitting: 7.1  Smokeless Tobacco Never Used     Outpatient Encounter Medications as of 11/08/2018  Medication Sig  . citalopram (CELEXA) 10 MG tablet 1 tablet by mouth daily  . [DISCONTINUED] citalopram (CELEXA)  20 MG tablet 1/2 tablet by mouth daily for one week then one tablet daily- hold at this dose  . traZODone (DESYREL) 50 MG tablet Take 0.5-1 tablets (25-50 mg total) by mouth at bedtime as needed for sleep. (Patient not taking: Reported on 11/08/2018)  . [DISCONTINUED] triamcinolone cream (KENALOG) 0.1 % Apply 1 application topically 2 (two) times daily.   No facility-administered encounter medications on file as of 11/08/2018.     Allergies: Patient has no known allergies.  Body mass index is 24.59 kg/m.  Blood pressure (!) 99/51, pulse 77, temperature 98.6 F (37 C), temperature source Oral, height 5' 3.5" (1.613 m), weight 141 lb (64 kg), SpO2 96 %. Review of Systems  Constitutional: Positive for fatigue. Negative for activity change, appetite change, chills, diaphoresis, fever and unexpected weight change.  Respiratory: Negative for cough, chest tightness, shortness of breath, wheezing and stridor.   Cardiovascular: Negative for chest pain, palpitations and leg swelling.  Gastrointestinal: Negative for constipation, diarrhea, nausea and vomiting.  Skin: Negative for color change, pallor, rash and wound.  Neurological: Negative for dizziness and headaches.  Hematological: Does not bruise/bleed easily.  Psychiatric/Behavioral: Positive for depression. Negative for behavioral problems, confusion, decreased concentration, dysphoric mood, hallucinations, self-injury, sleep disturbance and suicidal ideas. The patient has insomnia. The patient is not nervous/anxious and is not hyperactive.        Objective:   Physical Exam Constitutional:      General: She is not in acute distress.    Appearance: She is normal weight. She is not ill-appearing, toxic-appearing or diaphoretic.  Eyes:     Conjunctiva/sclera: Conjunctivae normal.     Pupils: Pupils are equal, round, and reactive to light.  Neck:     Musculoskeletal: Normal range of motion.  Cardiovascular:     Rate and Rhythm: Normal  rate.     Pulses: Normal pulses.     Heart sounds: Normal heart sounds. No murmur. No friction rub. No gallop.   Pulmonary:     Effort: Pulmonary effort is normal. No respiratory distress.     Breath sounds: No stridor. No wheezing, rhonchi or rales.  Chest:     Chest wall: No tenderness.  Lymphadenopathy:     Cervical: No cervical adenopathy.  Skin:    Capillary Refill: Capillary refill takes less than 2 seconds.  Neurological:     Mental Status: She is alert and oriented to person, place, and time.  Psychiatric:        Attention and Perception: Attention and perception normal.        Mood and Affect: Mood normal. Affect is not tearful.        Behavior: Behavior normal.        Thought Content: Thought content normal.        Judgment: Judgment normal.       Assessment & Plan:   1. Depression, major, single episode, moderate ([place])   2. GAD (generalized anxiety disorder)     GAD (generalized anxiety disorder) Remain on Citalopram  23m once daily. Remain well hydrated, eat a well balanced diet, and continue to walk daily. Continue with therapist as directed. CONGRATULATIONS ON YOUR NEW JOB!!! Follow-up in 3 months, sooner if needed.  Depression, major, single episode, moderate ([facility]) Remain on Citalopram 168monce daily. Remain well hydrated, eat a well balanced diet, and continue to walk daily. Continue with therapist as directed. CONGRATULATIONS ON YOUR NEW JOB!!! Follow-up in 3 months, sooner if needed.    FOLLOW-UP:  Return in about 3 months (around 02/06/2019) for General Anxiety Disorder, Depression.

## 2018-11-13 ENCOUNTER — Ambulatory Visit: Payer: 59 | Admitting: Adult Health

## 2019-07-10 ENCOUNTER — Telehealth: Payer: Self-pay | Admitting: Adult Health

## 2019-08-01 ENCOUNTER — Ambulatory Visit (INDEPENDENT_AMBULATORY_CARE_PROVIDER_SITE_OTHER): Payer: 59 | Admitting: Adult Health

## 2019-08-01 ENCOUNTER — Encounter: Payer: Self-pay | Admitting: Adult Health

## 2019-08-01 ENCOUNTER — Other Ambulatory Visit: Payer: Self-pay

## 2019-08-01 DIAGNOSIS — F321 Major depressive disorder, single episode, moderate: Secondary | ICD-10-CM

## 2019-08-01 DIAGNOSIS — F411 Generalized anxiety disorder: Secondary | ICD-10-CM

## 2019-08-01 DIAGNOSIS — Z0000 Encounter for general adult medical examination without abnormal findings: Secondary | ICD-10-CM

## 2019-08-01 MED ORDER — CITALOPRAM HYDROBROMIDE 10 MG PO TABS: ORAL_TABLET | ORAL | 0 refills | Status: DC

## 2019-08-01 NOTE — Progress Notes (Signed)
Virtual Visit via Telephone Note  I connected with Barbara [name] on 08/01/19 at  4:15 PM EST by telephone and verified that I am speaking with the correct person using two identifiers.  Location: Patient: Home Provider: In Clinic   I discussed the limitations, risks, security and privacy concerns of performing an evaluation and management service by telephone and the availability of in person appointments. I also discussed with the patient that there may be a patient responsible charge related to this service. The patient expressed understanding and agreed to proceed.   History of Present Illness: 10/11/2018 OV: Ms, [name] presents with rash on her neck that developed 6 weeks ago. Rash itches and burns and is localized to left anterior neck and left anterior chest. She reports that her mother has eczema. She has been using OTC Cortisone cream with some sx relief. She denies drainage from area She denies fever/night sweats/chills/N/V/D She denies recent travel outside [place] She also reports increase in stress/depression r/t husband's alcoholism He has been heavy drinker >10 years When they met initially met they both enjoyed social drinking that eventually escalated into excessive drinking. She stopped all alcohol use 2 years ago and he claimed to as well, however in reality he "closet drinking" She will find boxes of empty wine all over home He will be intoxicated at all times of the day He is not missing work or DUIs He ETOH use is triggering constant arguments and marital discourse She is currently in therapy, weekly appts-great! She is not using ETOH She denies thoughts of harming herself/others Since they recently moved to area, she does not have many local friends She does have one work Social worker in similar situation that she can lean for emotional support. She was on Citalopram in past, tolerated well She also reports insomnia r/t anxiety and "mind racing"  11/08/2018 OV: [name] is here for f/u: Depression and Insomnia She was started on Citalopram one month ago and has remained on 48m QD, she briefly titrated up to 25mand states "it seems just too much" She reports significant reduction in stress/anxiety. She has been seeing therapist once weekly She denies thoughts of harming herself/others She has starting daily walking regime- great! She continues to abstain from tobacco/vape/ETOH She denies thoughts of harming herself/others She reports talking to her husband about his drinking and he has noticeably reduced intake, however continues to drink He has had one TeleMedicine appt with therapist He continues to refuse couples therapy She has not used any Trazodone for insomnia, reports improved sleep with increase in exercise and reduction in stress  08/01/2019 OV: Barbara [name] in for f/u Depression. She reports stable mood, denies SI/HI. She is currently taking Citalopram 1065mD. Her husband has long-standing hx of alcohol abuse and this has been a large source of stress in the relationship. She has clearly expressed her concerns to him over his alcoholism. [place] and was ETOH free for 4 weeks until last night-he excessively drank and was verbally abusive. He came home at 1900, visibly intoxicated and had urinated on himself. He had driven himself home from wherever he had been drinking. Discussed that he is posing a threat to himself and others when is drives impaired. She is agreeable to BH/psychology referral. She denies feeling in fear of her safety at home, only "he becomes verbally abusive" when he drinks. She has been contemplating separating for years- encouraged her to discuss this with her therapist. She is  not using ETOH  Needs fasting labs   Patient Care Team    Relationship Specialty Notifications Start End  Esaw Grandchild, NP PCP - General Family Medicine  04/17/18     Patient Active Problem List   Diagnosis  Date Noted  . GAD (generalized anxiety disorder) 11/08/2018  . Depression, major, single episode, moderate ([place]) 10/11/2018  . Eczema 10/11/2018  . Insomnia 10/11/2018  . Abnormal CBC 06/12/2018  . Low blood pressure reading 06/12/2018  . Healthcare maintenance 05/18/2018  . Screening for breast cancer 05/18/2018  . Axillary pain, right 05/18/2018     History reviewed. No pertinent past medical history.   Past Surgical History:  Procedure Laterality Date  . [place]  . TUBAL LIGATION       Family History  Problem Relation Age of Onset  . Heart attack Father   . Diabetes Sister   . Heart attack Brother   . Diabetes Brother   . Heart attack Maternal Grandfather   . Diabetes Maternal Grandfather   . Cancer Paternal Grandmother 31       breast  . Breast cancer Paternal Grandmother   . Heart attack Paternal Grandfather      Social History   Substance and Sexual Activity  Drug Use Never     Social History   Substance and Sexual Activity  Alcohol Use Never  . Frequency: Never     Social History   Tobacco Use  Smoking Status Former Smoker  . Packs/day: 0.75  . Years: 30.00  . Pack years: 22.50  . Types: Cigarettes  . Quit date: 09/21/2011  . Years since quitting: 7.8  Smokeless Tobacco Never Used     Outpatient Encounter Medications as of 08/01/2019  Medication Sig  . citalopram (CELEXA) 10 MG tablet 1 tablet by mouth daily  . diphenhydrAMINE (BENADRYL) 25 mg capsule Take 25 mg by mouth daily.  . [DISCONTINUED] traZODone (DESYREL) 50 MG tablet Take 0.5-1 tablets (25-50 mg total) by mouth at bedtime as needed for sleep.   No facility-administered encounter medications on file as of 08/01/2019.     Allergies: Patient has no known allergies.  There is no height or weight on file to calculate BMI.  There were no vitals taken for this visit. Review of Systems: General:   Denies fever, chills, unexplained weight loss.   Optho/Auditory:   Denies visual changes, blurred vision/LOV Respiratory:   Denies SOB, DOE more than baseline levels.  Cardiovascular:   Denies chest pain, palpitations, new onset peripheral edema  Gastrointestinal:   Denies nausea, vomiting, diarrhea.  Genitourinary: Denies dysuria, freq/ urgency, flank pain or discharge from genitals.  Endocrine:     Denies hot or cold intolerance, polyuria, polydipsia. Musculoskeletal:   Denies unexplained myalgias, joint swelling, unexplained arthralgias, gait problems.  Skin:  Denies rash, suspicious lesions Neurological:     Denies dizziness, unexplained weakness, numbness  Psychiatric/Behavioral:   Denies mood changes, suicidal or homicidal ideations, hallucinations Stress +  Observations/Objective: No acute distress noted during the telephone conversation.  Assessment and Plan: Continue Citalopram 15m QD Urgent referral to BH/psychology placed. Increase water intake, strive for at least 75 ounces/day.   Follow Heart Healthy diet Increase regular exercise.  Recommend at least 30 minutes daily, 5 days per week of walking, biking, swimming, YouTube/Pinterest workout videos. If you ever feel unsafe- call 911 Do not allow husband to drive while under the influence  Follow Up Instructions: Fasting labs 1-2 weeks, OV 6 weeks,  sooner if needed   I discussed the assessment and treatment plan with the patient. The patient was provided an opportunity to ask questions and all were answered. The patient agreed with the plan and demonstrated an understanding of the instructions.   The patient was advised to call back or seek an in-person evaluation if the symptoms worsen or if the condition fails to improve as anticipated.  I provided 25 minutes of non-face-to-face time during this encounter.   Esaw Grandchild, NP

## 2019-08-02 NOTE — Assessment & Plan Note (Signed)
Assessment and Plan: Continue Citalopram 10mg  QD Urgent referral to BH/psychology placed. Increase water intake, strive for at least 75 ounces/day.   Follow Heart Healthy diet Increase regular exercise.  Recommend at least 30 minutes daily, 5 days per week of walking, biking, swimming, YouTube/Pinterest workout videos. If you ever feel unsafe- call 911 Do not allow husband to drive while under the influence  Follow Up Instructions: Fasting labs 1-2 weeks, OV 6 weeks, sooner if needed   I discussed the assessment and treatment plan with the patient. The patient was provided an opportunity to ask questions and all were answered. The patient agreed with the plan and demonstrated an understanding of the instructions.   The patient was advised to call back or seek an in-person evaluation if the symptoms worsen or if the condition fails to improve as anticipated.

## 2019-08-02 NOTE — Assessment & Plan Note (Signed)
Fasting labs 1-2 weeks, OV 6 weeks, sooner if needed

## 2019-09-05 ENCOUNTER — Ambulatory Visit: Payer: 59 | Admitting: Psychology

## 2019-09-12 ENCOUNTER — Other Ambulatory Visit: Payer: 59

## 2019-09-19 ENCOUNTER — Encounter: Payer: 59 | Admitting: Adult Health

## 2019-09-25 ENCOUNTER — Ambulatory Visit: Payer: 59 | Attending: Internal Medicine

## 2019-09-25 DIAGNOSIS — Z20822 Contact with and (suspected) exposure to covid-19: Secondary | ICD-10-CM

## 2019-09-26 LAB — NOVEL CORONAVIRUS, NAA: SARS-CoV-2, NAA: NOT DETECTED

## 2019-09-27 ENCOUNTER — Ambulatory Visit (INDEPENDENT_AMBULATORY_CARE_PROVIDER_SITE_OTHER): Payer: 59 | Admitting: Psychology

## 2019-09-27 ENCOUNTER — Other Ambulatory Visit: Payer: 59

## 2019-09-27 DIAGNOSIS — F331 Major depressive disorder, recurrent, moderate: Secondary | ICD-10-CM

## 2019-10-04 ENCOUNTER — Encounter: Payer: 59 | Admitting: Adult Health

## 2019-10-11 ENCOUNTER — Ambulatory Visit: Payer: 59 | Admitting: Psychology

## 2019-10-18 ENCOUNTER — Ambulatory Visit: Payer: 59 | Admitting: Psychology

## 2019-10-25 ENCOUNTER — Ambulatory Visit: Payer: 59 | Admitting: Psychology

## 2019-10-25 ENCOUNTER — Other Ambulatory Visit: Payer: Self-pay | Admitting: Adult Health

## 2019-10-25 ENCOUNTER — Other Ambulatory Visit: Payer: Self-pay | Admitting: Family Medicine

## 2019-10-25 DIAGNOSIS — Z1231 Encounter for screening mammogram for malignant neoplasm of breast: Secondary | ICD-10-CM

## 2019-10-30 NOTE — Progress Notes (Signed)
Subjective:    Patient ID: Barbara [name], female    DOB: 07/26/1961, 59 y.o.   MRN: [id number]  HPI:  Barbara [name] is here for CPE She reports stable mood, denies SI/HI She is currently on citalopram 49m QD Her husband is attending AA and trying to reduce ETOH use. She is planning on starting a regular walking program tonight- great! Will start with walking from her home to end of her block, then increase distance weekly as tolerated. She continues to abstain from tobacco/vape/ETOH use. She feels more hopeful for the future of her marriage. She feels safe at home.  Healthcare Maintenance: PAP-Referred to OB/GYN Mammogram-Ordered Colonoscopy-Referral placed to GI Immunizations-declined influenza   Patient Care Team    Relationship Specialty Notifications Start End  DEsaw Grandchild NP PCP - General Family Medicine  04/17/18     Patient Active Problem List   Diagnosis Date Noted  . GAD (generalized anxiety disorder) 11/08/2018  . Depression, major, single episode, moderate ([place] 10/11/2018  . Eczema 10/11/2018  . Insomnia 10/11/2018  . Abnormal CBC 06/12/2018  . Low blood pressure reading 06/12/2018  . Healthcare maintenance 05/18/2018  . Screening for breast cancer 05/18/2018  . Axillary pain, right 05/18/2018     History reviewed. No pertinent past medical history.   Past Surgical History:  Procedure Laterality Date  . [place] . TUBAL LIGATION       Family History  Problem Relation Age of Onset  . Heart attack Father   . Diabetes Sister   . Heart attack Brother   . Diabetes Brother   . Heart attack Maternal Grandfather   . Diabetes Maternal Grandfather   . Cancer Paternal Grandmother 474      breast  . Breast cancer Paternal Grandmother   . Heart attack Paternal Grandfather      Social History   Substance and Sexual Activity  Drug Use Never     Social History   Substance and Sexual Activity  Alcohol Use Never      Social History   Tobacco Use  Smoking Status Former Smoker  . Packs/day: 0.75  . Years: 30.00  . Pack years: 22.50  . Types: Cigarettes  . Quit date: 09/21/2011  . Years since quitting: 8.1  Smokeless Tobacco Never Used     Outpatient Encounter Medications as of 11/01/2019  Medication Sig  . citalopram (CELEXA) 10 MG tablet 1 tablet by mouth daily  . diphenhydrAMINE (BENADRYL) 25 mg capsule Take 25 mg by mouth daily.  . [DISCONTINUED] citalopram (CELEXA) 10 MG tablet 1 tablet by mouth daily   No facility-administered encounter medications on file as of 11/01/2019.    Allergies: Patient has no known allergies.  Body mass index is 24.2 kg/m.  Blood pressure 97/63, pulse (!) 59, temperature 97.9 F (36.6 C), temperature source Oral, height 5' 3.5" (1.613 m), weight 138 lb 12.8 oz (63 kg), SpO2 100 %.     Review of Systems  Constitutional: Positive for fatigue. Negative for activity change, appetite change, chills, diaphoresis, fever and unexpected weight change.  HENT: Negative for congestion.   Eyes: Negative for visual disturbance.  Respiratory: Negative for cough, chest tightness, shortness of breath, wheezing and stridor.   Cardiovascular: Negative for chest pain, palpitations and leg swelling.  Gastrointestinal: Negative for abdominal distention, abdominal pain, blood in stool, constipation, diarrhea, nausea and vomiting.  Endocrine: Negative for polydipsia, polyphagia and polyuria.  Genitourinary: Negative for difficulty urinating and flank  pain.  Musculoskeletal: Negative for arthralgias, back pain, gait problem, joint swelling, myalgias and neck stiffness.  Neurological: Negative for dizziness and headaches.  Hematological: Negative for adenopathy. Does not bruise/bleed easily.  Psychiatric/Behavioral: Negative for suicidal ideas.       Objective:   Physical Exam Vitals and nursing note reviewed.  Constitutional:      General: She is not in acute  distress.    Appearance: Normal appearance. She is normal weight. She is not ill-appearing, toxic-appearing or diaphoretic.  HENT:     Head: Normocephalic and atraumatic.     Right Ear: Tympanic membrane, ear canal and external ear normal. There is no impacted cerumen.     Left Ear: Tympanic membrane, ear canal and external ear normal. There is no impacted cerumen.     Nose: Nose normal. No congestion.     Mouth/Throat:     Mouth: Mucous membranes are moist.     Pharynx: No oropharyngeal exudate.  Eyes:     Conjunctiva/sclera: Conjunctivae normal.     Pupils: Pupils are equal, round, and reactive to light.  Cardiovascular:     Rate and Rhythm: Normal rate and regular rhythm.     Pulses: Normal pulses.     Heart sounds: Normal heart sounds. No murmur. No friction rub. No gallop.   Pulmonary:     Effort: Pulmonary effort is normal. No respiratory distress.     Breath sounds: Normal breath sounds. No stridor. No wheezing, rhonchi or rales.  Chest:     Chest wall: No tenderness.  Abdominal:     General: Abdomen is flat. Bowel sounds are normal. There is no distension.     Palpations: Abdomen is soft. There is no mass.     Tenderness: There is no abdominal tenderness. There is no guarding or rebound.     Hernia: No hernia is present.  Musculoskeletal:        General: Normal range of motion.     Cervical back: Neck supple.  Skin:    General: Skin is warm.  Neurological:     Mental Status: She is alert and oriented to person, place, and time.     Coordination: Coordination normal.  Psychiatric:        Mood and Affect: Mood normal.        Behavior: Behavior normal.        Thought Content: Thought content normal.        Judgment: Judgment normal.       Assessment & Plan:   1. Healthcare maintenance   2. Screening for HIV (human immunodeficiency virus)   3. Encounter for hepatitis C screening test for low risk patient   4. Screening for colon cancer   5. Hearing difficulty,  unspecified laterality   6. Encounter for well woman exam with routine gynecological exam   7. GAD (generalized anxiety disorder)   8. Depression, major, single episode, moderate ([place])     Healthcare maintenance Increase water intake, strive for at least half of your body weight in ounces per day.   Follow Heart Healthy diet Increase regular exercise.  Recommend at least 30 minutes daily, 5 days per week of walking, biking, swimming, YouTube/Pinterest workout videos. We will contact you with lab results. Continue to social distance and wear a mask when in public. Basic information for mRNA COVID-19 vaccines Please note that these CDC guidelines can change from week to week, and even day to day.  Refer to the [facility] website for the most  current recommendations for any given day at [email or web address]   1) Under the EUAs (emergency use authorization), the following age groups are authorized to receive the COVID-19 vaccination: . Pfizer-BioNTech: ages ?16 years . Moderna: ages ?18 years . Children and adolescents outside of these authorized age groups should not receive COVID-19 vaccination at this time   2) You are not eligible to receive a COVID-19 vaccine if: . You have received another vaccine in the last 14-days (example: flu shot, shingles, tetanus, etc.) . You currently have a positive test for COVID-19. The vaccine must be deferred until you have recovered from the acute illness and the criteria has been met for you to discontinue isolation. . You have received passive antibody therapy (monoclonal antibodies or convalescent serum) as treatment for COVID-19 within the past 90 days   3) Currently COVID-19 vaccines are contraindicated in patients who have had an anaphylactic reaction to polyethylene glycol ( ie. GoLYTELY bowel prep or MiraLAX ) or polysorbate products (often used as a thickening agent for many ice creams, frozen custard and whipped dessert products etc).    Also, if you had an anaphylactic reaction to your first COVID-19 vaccination, you cannot receive your second.     - Also if you are currently pregnant please check with your obstetrician to see what their recommendations are regarding vaccination.   - Also, if you have a chronic condition such as CLL, RA or other immunocompromising conditions, please know that no data are currently available (or very limited data) on the safety and efficacy of mRNA COVID-19 vaccines in persons with autoimmune/ immunocompromising conditions.   Please seek the advice of your specialist regarding whether or not you should receive your COVID-19 vaccination.   Follow-up with primary care in 6 months.  It has been wonderful working with you at [facility]!  GAD (generalized anxiety disorder) She reports stable mood, denies SI/HI She is currently on citalopram 16m QD  Depression, major, single episode, moderate ([place] She reports stable mood, denies SI/HI She is currently on citalopram 175mQD    FOLLOW-UP:  Return in about 6 months (around 04/30/2020) for Regular Follow Up, General Anxiety Disorder, Depression.

## 2019-11-01 ENCOUNTER — Other Ambulatory Visit: Payer: Self-pay

## 2019-11-01 ENCOUNTER — Ambulatory Visit (INDEPENDENT_AMBULATORY_CARE_PROVIDER_SITE_OTHER): Payer: 59 | Admitting: Adult Health

## 2019-11-01 ENCOUNTER — Ambulatory Visit: Payer: 59 | Admitting: Psychology

## 2019-11-01 ENCOUNTER — Encounter: Payer: Self-pay | Admitting: Adult Health

## 2019-11-01 VITALS — BP 97/63 | HR 59 | Temp 97.9°F | Ht 63.5 in | Wt 138.8 lb

## 2019-11-01 DIAGNOSIS — Z114 Encounter for screening for human immunodeficiency virus [HIV]: Secondary | ICD-10-CM | POA: Diagnosis not present

## 2019-11-01 DIAGNOSIS — Z1159 Encounter for screening for other viral diseases: Secondary | ICD-10-CM | POA: Diagnosis not present

## 2019-11-01 DIAGNOSIS — Z0000 Encounter for general adult medical examination without abnormal findings: Secondary | ICD-10-CM

## 2019-11-01 DIAGNOSIS — H9190 Unspecified hearing loss, unspecified ear: Secondary | ICD-10-CM

## 2019-11-01 DIAGNOSIS — Z1211 Encounter for screening for malignant neoplasm of colon: Secondary | ICD-10-CM

## 2019-11-01 DIAGNOSIS — Z01419 Encounter for gynecological examination (general) (routine) without abnormal findings: Secondary | ICD-10-CM

## 2019-11-01 DIAGNOSIS — F321 Major depressive disorder, single episode, moderate: Secondary | ICD-10-CM

## 2019-11-01 DIAGNOSIS — F411 Generalized anxiety disorder: Secondary | ICD-10-CM

## 2019-11-01 MED ORDER — CITALOPRAM HYDROBROMIDE 10 MG PO TABS: ORAL_TABLET | ORAL | 0 refills | Status: DC

## 2019-11-01 NOTE — Assessment & Plan Note (Signed)
She reports stable mood, denies SI/HI She is currently on citalopram 10mg  QD

## 2019-11-01 NOTE — Patient Instructions (Addendum)
Preventive Care for Adults, Female  A healthy lifestyle and preventive care can promote health and wellness. Preventive health guidelines for women include the following key practices.   A routine yearly physical is a good way to check with your health care provider about your health and preventive screening. It is a chance to share any concerns and updates on your health and to receive a thorough exam.   Visit your dentist for a routine exam and preventive care every 6 months. Brush your teeth twice a day and floss once a day. Good oral hygiene prevents tooth decay and gum disease.   The frequency of eye exams is based on your age, health, family medical history, use of contact lenses, and other factors. Follow your health care provider's recommendations for frequency of eye exams.   Eat a healthy diet. Foods like vegetables, fruits, whole grains, low-fat dairy products, and lean protein foods contain the nutrients you need without too many calories. Decrease your intake of foods high in solid fats, added sugars, and salt. Eat the right amount of calories for you.Get information about a proper diet from your health care provider, if necessary.   Regular physical exercise is one of the most important things you can do for your health. Most adults should get at least 150 minutes of moderate-intensity exercise (any activity that increases your heart rate and causes you to sweat) each week. In addition, most adults need muscle-strengthening exercises on 2 or more days a week.   Maintain a healthy weight. The body mass index (BMI) is a screening tool to identify possible weight problems. It provides an estimate of body fat based on height and weight. Your health care provider can find your BMI, and can help you achieve or maintain a healthy weight.For adults 20 years and older:   - A BMI below 18.5 is considered underweight.   - A BMI of 18.5 to 24.9 is normal.   - A BMI of 25 to 29.9 is  considered overweight.   - A BMI of 30 and above is considered obese.   Maintain normal blood lipids and cholesterol levels by exercising and minimizing your intake of trans and saturated fats.  Eat a balanced diet with plenty of fruit and vegetables. Blood tests for lipids and cholesterol should begin at age 20 and be repeated every 5 years minimum.  If your lipid or cholesterol levels are high, you are over 40, or you are at high risk for heart disease, you may need your cholesterol levels checked more frequently.Ongoing high lipid and cholesterol levels should be treated with medicines if diet and exercise are not working.   If you smoke, find out from your health care provider how to quit. If you do not use tobacco, do not start.   Lung cancer screening is recommended for adults aged 55-80 years who are at high risk for developing lung cancer because of a history of smoking. A yearly low-dose CT scan of the lungs is recommended for people who have at least a 30-pack-year history of smoking and are a current smoker or have quit within the past 15 years. A pack year of smoking is smoking an average of 1 pack of cigarettes a day for 1 year (for example: 1 pack a day for 30 years or 2 packs a day for 15 years). Yearly screening should continue until the smoker has stopped smoking for at least 15 years. Yearly screening should be stopped for people who develop a   health problem that would prevent them from having lung cancer treatment.   If you are pregnant, do not drink alcohol. If you are breastfeeding, be very cautious about drinking alcohol. If you are not pregnant and choose to drink alcohol, do not have more than 1 drink per day. One drink is considered to be 12 ounces (355 mL) of beer, 5 ounces (148 mL) of wine, or 1.5 ounces (44 mL) of liquor.   Avoid use of street drugs. Do not share needles with anyone. Ask for help if you need support or instructions about stopping the use of  drugs.   High blood pressure causes heart disease and increases the risk of stroke. Your blood pressure should be checked at least yearly.  Ongoing high blood pressure should be treated with medicines if weight loss and exercise do not work.   If you are 69-55 years old, ask your health care provider if you should take aspirin to prevent strokes.   Diabetes screening involves taking a blood sample to check your fasting blood sugar level. This should be done once every 3 years, after age 38, if you are within normal weight and without risk factors for diabetes. Testing should be considered at a younger age or be carried out more frequently if you are overweight and have at least 1 risk factor for diabetes.   Breast cancer screening is essential preventive care for women. You should practice "breast self-awareness."  This means understanding the normal appearance and feel of your breasts and may include breast self-examination.  Any changes detected, no matter how small, should be reported to a health care provider.  Women in their 80s and 30s should have a clinical breast exam (CBE) by a health care provider as part of a regular health exam every 1 to 3 years.  After age 66, women should have a CBE every year.  Starting at age 1, women should consider having a mammogram (breast X-ray test) every year.  Women who have a family history of breast cancer should talk to their health care provider about genetic screening.  Women at a high risk of breast cancer should talk to their health care providers about having an MRI and a mammogram every year.   -Breast cancer gene (BRCA)-related cancer risk assessment is recommended for women who have family members with BRCA-related cancers. BRCA-related cancers include breast, ovarian, tubal, and peritoneal cancers. Having family members with these cancers may be associated with an increased risk for harmful changes (mutations) in the breast cancer genes BRCA1 and  BRCA2. Results of the assessment will determine the need for genetic counseling and BRCA1 and BRCA2 testing.   The Pap test is a screening test for cervical cancer. A Pap test can show cell changes on the cervix that might become cervical cancer if left untreated. A Pap test is a procedure in which cells are obtained and examined from the lower end of the uterus (cervix).   - Women should have a Pap test starting at age 57.   - Between ages 90 and 70, Pap tests should be repeated every 2 years.   - Beginning at age 63, you should have a Pap test every 3 years as long as the past 3 Pap tests have been normal.   - Some women have medical problems that increase the chance of getting cervical cancer. Talk to your health care provider about these problems. It is especially important to talk to your health care provider if a  new problem develops soon after your last Pap test. In these cases, your health care provider may recommend more frequent screening and Pap tests.   - The above recommendations are the same for women who have or have not gotten the vaccine for human papillomavirus (HPV).   - If you had a hysterectomy for a problem that was not cancer or a condition that could lead to cancer, then you no longer need Pap tests. Even if you no longer need a Pap test, a regular exam is a good idea to make sure no other problems are starting.   - If you are between ages 36 and 66 years, and you have had normal Pap tests going back 10 years, you no longer need Pap tests. Even if you no longer need a Pap test, a regular exam is a good idea to make sure no other problems are starting.   - If you have had past treatment for cervical cancer or a condition that could lead to cancer, you need Pap tests and screening for cancer for at least 20 years after your treatment.   - If Pap tests have been discontinued, risk factors (such as a new sexual partner) need to be reassessed to determine if screening should  be resumed.   - The HPV test is an additional test that may be used for cervical cancer screening. The HPV test looks for the virus that can cause the cell changes on the cervix. The cells collected during the Pap test can be tested for HPV. The HPV test could be used to screen women aged 70 years and older, and should be used in women of any age who have unclear Pap test results. After the age of 67, women should have HPV testing at the same frequency as a Pap test.   Colorectal cancer can be detected and often prevented. Most routine colorectal cancer screening begins at the age of 57 years and continues through age 26 years. However, your health care provider may recommend screening at an earlier age if you have risk factors for colon cancer. On a yearly basis, your health care provider may provide home test kits to check for hidden blood in the stool.  Use of a small camera at the end of a tube, to directly examine the colon (sigmoidoscopy or colonoscopy), can detect the earliest forms of colorectal cancer. Talk to your health care provider about this at age 23, when routine screening begins. Direct exam of the colon should be repeated every 5 -10 years through age 49 years, unless early forms of pre-cancerous polyps or small growths are found.   People who are at an increased risk for hepatitis B should be screened for this virus. You are considered at high risk for hepatitis B if:  -You were born in a country where hepatitis B occurs often. Talk with your health care provider about which countries are considered high risk.  - Your parents were born in a high-risk country and you have not received a shot to protect against hepatitis B (hepatitis B vaccine).  - You have HIV or AIDS.  - You use needles to inject street drugs.  - You live with, or have sex with, someone who has Hepatitis B.  - You get hemodialysis treatment.  - You take certain medicines for conditions like cancer, organ  transplantation, and autoimmune conditions.   Hepatitis C blood testing is recommended for all people born from [birth year] through 1965 and any individual  with known risks for hepatitis C.   Practice safe sex. Use condoms and avoid high-risk sexual practices to reduce the spread of sexually transmitted infections (STIs). STIs include gonorrhea, chlamydia, syphilis, trichomonas, herpes, HPV, and human immunodeficiency virus (HIV). Herpes, HIV, and HPV are viral illnesses that have no cure. They can result in disability, cancer, and death. Sexually active women aged 25 years and younger should be checked for chlamydia. Older women with new or multiple partners should also be tested for chlamydia. Testing for other STIs is recommended if you are sexually active and at increased risk.   Osteoporosis is a disease in which the bones lose minerals and strength with aging. This can result in serious bone fractures or breaks. The risk of osteoporosis can be identified using a bone density scan. Women ages 65 years and over and women at risk for fractures or osteoporosis should discuss screening with their health care providers. Ask your health care provider whether you should take a calcium supplement or vitamin D to There are also several preventive steps women can take to avoid osteoporosis and resulting fractures or to keep osteoporosis from worsening. -->Recommendations include:  Eat a balanced diet high in fruits, vegetables, calcium, and vitamins.  Get enough calcium. The recommended total intake of is 1,200 mg daily; for best absorption, if taking supplements, divide doses into 250-500 mg doses throughout the day. Of the two types of calcium, calcium carbonate is best absorbed when taken with food but calcium citrate can be taken on an empty stomach.  Get enough vitamin D. NAMS and the National Osteoporosis Foundation recommend at least 1,000 IU per day for women age 50 and over who are at risk of vitamin D  deficiency. Vitamin D deficiency can be caused by inadequate sun exposure (for example, those who live in northern latitudes).  Avoid alcohol and smoking. Heavy alcohol intake (more than 7 drinks per week) increases the risk of falls and hip fracture and women smokers tend to lose bone more rapidly and have lower bone mass than nonsmokers. Stopping smoking is one of the most important changes women can make to improve their health and decrease risk for disease.  Be physically active every day. Weight-bearing exercise (for example, fast walking, hiking, jogging, and weight training) may strengthen bones or slow the rate of bone loss that comes with aging. Balancing and muscle-strengthening exercises can reduce the risk of falling and fracture.  Consider therapeutic medications. Currently, several types of effective drugs are available. Healthcare providers can recommend the type most appropriate for each woman.  Eliminate environmental factors that may contribute to accidents. Falls cause nearly 90% of all osteoporotic fractures, so reducing this risk is an important bone-health strategy. Measures include ample lighting, removing obstructions to walking, using nonskid rugs on floors, and placing mats and/or grab bars in showers.  Be aware of medication side effects. Some common medicines make bones weaker. These include a type of steroid drug called glucocorticoids used for arthritis and asthma, some antiseizure drugs, certain sleeping pills, treatments for endometriosis, and some cancer drugs. An overactive thyroid gland or using too much thyroid hormone for an underactive thyroid can also be a problem. If you are taking these medicines, talk to your doctor about what you can do to help protect your bones.reduce the rate of osteoporosis.    Menopause can be associated with physical symptoms and risks. Hormone replacement therapy is available to decrease symptoms and risks. You should talk to your  health care provider   about whether hormone replacement therapy is right for you.   Use sunscreen. Apply sunscreen liberally and repeatedly throughout the day. You should seek shade when your shadow is shorter than you. Protect yourself by wearing long sleeves, pants, a wide-brimmed hat, and sunglasses year round, whenever you are outdoors.   Once a month, do a whole body skin exam, using a mirror to look at the skin on your back. Tell your health care provider of new moles, moles that have irregular borders, moles that are larger than a pencil eraser, or moles that have changed in shape or color.   -Stay current with required vaccines (immunizations).   Influenza vaccine. All adults should be immunized every year.  Tetanus, diphtheria, and acellular pertussis (Td, Tdap) vaccine. Pregnant women should receive 1 dose of Tdap vaccine during each pregnancy. The dose should be obtained regardless of the length of time since the last dose. Immunization is preferred during the 27th 36th week of gestation. An adult who has not previously received Tdap or who does not know her vaccine status should receive 1 dose of Tdap. This initial dose should be followed by tetanus and diphtheria toxoids (Td) booster doses every 10 years. Adults with an unknown or incomplete history of completing a 3-dose immunization series with Td-containing vaccines should begin or complete a primary immunization series including a Tdap dose. Adults should receive a Td booster every 10 years.  Varicella vaccine. An adult without evidence of immunity to varicella should receive 2 doses or a second dose if she has previously received 1 dose. Pregnant females who do not have evidence of immunity should receive the first dose after pregnancy. This first dose should be obtained before leaving the health care facility. The second dose should be obtained 4 8 weeks after the first dose.  Human papillomavirus (HPV) vaccine. Females aged 13 26  years who have not received the vaccine previously should obtain the 3-dose series. The vaccine is not recommended for use in pregnant females. However, pregnancy testing is not needed before receiving a dose. If a female is found to be pregnant after receiving a dose, no treatment is needed. In that case, the remaining doses should be delayed until after the pregnancy. Immunization is recommended for any person with an immunocompromised condition through the age of 26 years if she did not get any or all doses earlier. During the 3-dose series, the second dose should be obtained 4 8 weeks after the first dose. The third dose should be obtained 24 weeks after the first dose and 16 weeks after the second dose.  Zoster vaccine. One dose is recommended for adults aged 60 years or older unless certain conditions are present.  Measles, mumps, and rubella (MMR) vaccine. Adults born before 1957 generally are considered immune to measles and mumps. Adults born in 1957 or later should have 1 or more doses of MMR vaccine unless there is a contraindication to the vaccine or there is laboratory evidence of immunity to each of the three diseases. A routine second dose of MMR vaccine should be obtained at least 28 days after the first dose for students attending postsecondary schools, health care workers, or international travelers. People who received inactivated measles vaccine or an unknown type of measles vaccine during 1963 1967 should receive 2 doses of MMR vaccine. People who received inactivated mumps vaccine or an unknown type of mumps vaccine before 1979 and are at high risk for mumps infection should consider immunization with 2 doses of   MMR vaccine. For females of childbearing age, rubella immunity should be determined. If there is no evidence of immunity, females who are not pregnant should be vaccinated. If there is no evidence of immunity, females who are pregnant should delay immunization until after pregnancy.  Unvaccinated health care workers born before [birth year] who lack laboratory evidence of measles, mumps, or rubella immunity or laboratory confirmation of disease should consider measles and mumps immunization with 2 doses of MMR vaccine or rubella immunization with 1 dose of MMR vaccine.  Pneumococcal 13-valent conjugate (PCV13) vaccine. When indicated, a person who is uncertain of her immunization history and has no record of immunization should receive the PCV13 vaccine. An adult aged 54 years or older who has certain medical conditions and has not been previously immunized should receive 1 dose of PCV13 vaccine. This PCV13 should be followed with a dose of pneumococcal polysaccharide (PPSV23) vaccine. The PPSV23 vaccine dose should be obtained at least 8 weeks after the dose of PCV13 vaccine. An adult aged 58 years or older who has certain medical conditions and previously received 1 or more doses of PPSV23 vaccine should receive 1 dose of PCV13. The PCV13 vaccine dose should be obtained 1 or more years after the last PPSV23 vaccine dose.  Pneumococcal polysaccharide (PPSV23) vaccine. When PCV13 is also indicated, PCV13 should be obtained first. All adults aged 58 years and older should be immunized. An adult younger than age 65 years who has certain medical conditions should be immunized. Any person who resides in a nursing home or long-term care facility should be immunized. An adult smoker should be immunized. People with an immunocompromised condition and certain other conditions should receive both PCV13 and PPSV23 vaccines. People with human immunodeficiency virus (HIV) infection should be immunized as soon as possible after diagnosis. Immunization during chemotherapy or radiation therapy should be avoided. Routine use of PPSV23 vaccine is not recommended for American Indians, [place] Natives, or people younger than 65 years unless there are medical conditions that require PPSV23 vaccine. When indicated,  people who have unknown immunization and have no record of immunization should receive PPSV23 vaccine. One-time revaccination 5 years after the first dose of PPSV23 is recommended for people aged 70 64 years who have chronic kidney failure, nephrotic syndrome, asplenia, or immunocompromised conditions. People who received 1 2 doses of PPSV23 before age 32 years should receive another dose of PPSV23 vaccine at age 96 years or later if at least 5 years have passed since the previous dose. Doses of PPSV23 are not needed for people immunized with PPSV23 at or after age 55 years.  Meningococcal vaccine. Adults with asplenia or persistent complement component deficiencies should receive 2 doses of quadrivalent meningococcal conjugate (MenACWY-D) vaccine. The doses should be obtained at least 2 months apart. Microbiologists working with certain meningococcal bacteria, [employer] recruits, people at risk during an outbreak, and people who travel to or live in countries with a high rate of meningitis should be immunized. A first-year college student up through age 58 years who is living in a residence hall should receive a dose if she did not receive a dose on or after her 16th birthday. Adults who have certain high-risk conditions should receive one or more doses of vaccine.  Hepatitis A vaccine. Adults who wish to be protected from this disease, have certain high-risk conditions, work with hepatitis A-infected animals, work in hepatitis A research labs, or travel to or work in countries with a high rate of hepatitis A should be  immunized. Adults who were previously unvaccinated and who anticipate close contact with an international adoptee during the first 60 days after arrival in the [place] from a country with a high rate of hepatitis A should be immunized.  Hepatitis B vaccine.  Adults who wish to be protected from this disease, have certain high-risk conditions, may be exposed to blood or other infectious  body fluids, are household contacts or sex partners of hepatitis B positive people, are clients or workers in certain care facilities, or travel to or work in countries with a high rate of hepatitis B should be immunized.  Haemophilus influenzae type b (Hib) vaccine. A previously unvaccinated person with asplenia or sickle cell disease or having a scheduled splenectomy should receive 1 dose of Hib vaccine. Regardless of previous immunization, a recipient of a hematopoietic stem cell transplant should receive a 3-dose series 6 12 months after her successful transplant. Hib vaccine is not recommended for adults with HIV infection.  Preventive Services / Frequency Ages 6 to 39years  Blood pressure check.** / Every 1 to 2 years.  Lipid and cholesterol check.** / Every 5 years beginning at age 39.  Clinical breast exam.** / Every 3 years for women in their 61s and 62s.  BRCA-related cancer risk assessment.** / For women who have family members with a BRCA-related cancer (breast, ovarian, tubal, or peritoneal cancers).  Pap test.** / Every 2 years from ages 47 through 85. Every 3 years starting at age 34 through age 12 or 74 with a history of 3 consecutive normal Pap tests.  HPV screening.** / Every 3 years from ages 46 through ages 43 to 54 with a history of 3 consecutive normal Pap tests.  Hepatitis C blood test.** / For any individual with known risks for hepatitis C.  Skin self-exam. / Monthly.  Influenza vaccine. / Every year.  Tetanus, diphtheria, and acellular pertussis (Tdap, Td) vaccine.** / Consult your health care provider. Pregnant women should receive 1 dose of Tdap vaccine during each pregnancy. 1 dose of Td every 10 years.  Varicella vaccine.** / Consult your health care provider. Pregnant females who do not have evidence of immunity should receive the first dose after pregnancy.  HPV vaccine. / 3 doses over 6 months, if 64 and younger. The vaccine is not recommended for use in  pregnant females. However, pregnancy testing is not needed before receiving a dose.  Measles, mumps, rubella (MMR) vaccine.** / You need at least 1 dose of MMR if you were born in 1957 or later. You may also need a 2nd dose. For females of childbearing age, rubella immunity should be determined. If there is no evidence of immunity, females who are not pregnant should be vaccinated. If there is no evidence of immunity, females who are pregnant should delay immunization until after pregnancy.  Pneumococcal 13-valent conjugate (PCV13) vaccine.** / Consult your health care provider.  Pneumococcal polysaccharide (PPSV23) vaccine.** / 1 to 2 doses if you smoke cigarettes or if you have certain conditions.  Meningococcal vaccine.** / 1 dose if you are age 71 to 37 years and a Market researcher living in a residence hall, or have one of several medical conditions, you need to get vaccinated against meningococcal disease. You may also need additional booster doses.  Hepatitis A vaccine.** / Consult your health care provider.  Hepatitis B vaccine.** / Consult your health care provider.  Haemophilus influenzae type b (Hib) vaccine.** / Consult your health care provider.  Ages 55 to 64years  Blood pressure check.** / Every 1 to 2 years.  Lipid and cholesterol check.** / Every 5 years beginning at age 20 years.  Lung cancer screening. / Every year if you are aged 55 80 years and have a 30-pack-year history of smoking and currently smoke or have quit within the past 15 years. Yearly screening is stopped once you have quit smoking for at least 15 years or develop a health problem that would prevent you from having lung cancer treatment.  Clinical breast exam.** / Every year after age 40 years.  BRCA-related cancer risk assessment.** / For women who have family members with a BRCA-related cancer (breast, ovarian, tubal, or peritoneal cancers).  Mammogram.** / Every year beginning at age 40  years and continuing for as long as you are in good health. Consult with your health care provider.  Pap test.** / Every 3 years starting at age 30 years through age 65 or 70 years with a history of 3 consecutive normal Pap tests.  HPV screening.** / Every 3 years from ages 30 years through ages 65 to 70 years with a history of 3 consecutive normal Pap tests.  Fecal occult blood test (FOBT) of stool. / Every year beginning at age 50 years and continuing until age 75 years. You may not need to do this test if you get a colonoscopy every 10 years.  Flexible sigmoidoscopy or colonoscopy.** / Every 5 years for a flexible sigmoidoscopy or every 10 years for a colonoscopy beginning at age 50 years and continuing until age 75 years.  Hepatitis C blood test.** / For all people born from 1945 through 1965 and any individual with known risks for hepatitis C.  Skin self-exam. / Monthly.  Influenza vaccine. / Every year.  Tetanus, diphtheria, and acellular pertussis (Tdap/Td) vaccine.** / Consult your health care provider. Pregnant women should receive 1 dose of Tdap vaccine during each pregnancy. 1 dose of Td every 10 years.  Varicella vaccine.** / Consult your health care provider. Pregnant females who do not have evidence of immunity should receive the first dose after pregnancy.  Zoster vaccine.** / 1 dose for adults aged 60 years or older.  Measles, mumps, rubella (MMR) vaccine.** / You need at least 1 dose of MMR if you were born in 1957 or later. You may also need a 2nd dose. For females of childbearing age, rubella immunity should be determined. If there is no evidence of immunity, females who are not pregnant should be vaccinated. If there is no evidence of immunity, females who are pregnant should delay immunization until after pregnancy.  Pneumococcal 13-valent conjugate (PCV13) vaccine.** / Consult your health care provider.  Pneumococcal polysaccharide (PPSV23) vaccine.** / 1 to 2 doses if  you smoke cigarettes or if you have certain conditions.  Meningococcal vaccine.** / Consult your health care provider.  Hepatitis A vaccine.** / Consult your health care provider.  Hepatitis B vaccine.** / Consult your health care provider.  Haemophilus influenzae type b (Hib) vaccine.** / Consult your health care provider.  Ages 65 years and over  Blood pressure check.** / Every 1 to 2 years.  Lipid and cholesterol check.** / Every 5 years beginning at age 20 years.  Lung cancer screening. / Every year if you are aged 55 80 years and have a 30-pack-year history of smoking and currently smoke or have quit within the past 15 years. Yearly screening is stopped once you have quit smoking for at least 15 years or develop a health problem that   would prevent you from having lung cancer treatment.  Clinical breast exam.** / Every year after age 103 years.  BRCA-related cancer risk assessment.** / For women who have family members with a BRCA-related cancer (breast, ovarian, tubal, or peritoneal cancers).  Mammogram.** / Every year beginning at age 36 years and continuing for as long as you are in good health. Consult with your health care provider.  Pap test.** / Every 3 years starting at age 5 years through age 85 or 10 years with 3 consecutive normal Pap tests. Testing can be stopped between 65 and 70 years with 3 consecutive normal Pap tests and no abnormal Pap or HPV tests in the past 10 years.  HPV screening.** / Every 3 years from ages 93 years through ages 70 or 45 years with a history of 3 consecutive normal Pap tests. Testing can be stopped between 65 and 70 years with 3 consecutive normal Pap tests and no abnormal Pap or HPV tests in the past 10 years.  Fecal occult blood test (FOBT) of stool. / Every year beginning at age 8 years and continuing until age 45 years. You may not need to do this test if you get a colonoscopy every 10 years.  Flexible sigmoidoscopy or colonoscopy.** /  Every 5 years for a flexible sigmoidoscopy or every 10 years for a colonoscopy beginning at age 69 years and continuing until age 68 years.  Hepatitis C blood test.** / For all people born from [birth year] through 1967 and any individual with known risks for hepatitis C.  Osteoporosis screening.** / A one-time screening for women ages 7 years and over and women at risk for fractures or osteoporosis.  Skin self-exam. / Monthly.  Influenza vaccine. / Every year.  Tetanus, diphtheria, and acellular pertussis (Tdap/Td) vaccine.** / 1 dose of Td every 10 years.  Varicella vaccine.** / Consult your health care provider.  Zoster vaccine.** / 1 dose for adults aged 5 years or older.  Pneumococcal 13-valent conjugate (PCV13) vaccine.** / Consult your health care provider.  Pneumococcal polysaccharide (PPSV23) vaccine.** / 1 dose for all adults aged 74 years and older.  Meningococcal vaccine.** / Consult your health care provider.  Hepatitis A vaccine.** / Consult your health care provider.  Hepatitis B vaccine.** / Consult your health care provider.  Haemophilus influenzae type b (Hib) vaccine.** / Consult your health care provider. ** Family history and personal history of risk and conditions may change your health care provider's recommendations. Document Released: 11/02/2001 Document Revised: 06/27/2013  [facility] Patient Information 2014 [place], [place].   EXERCISE AND DIET:  We recommended that you start or continue a regular exercise program for good health. Regular exercise means any activity that makes your heart beat faster and makes you sweat.  We recommend exercising at least 30 minutes per day at least 3 days a week, preferably 5.  We also recommend a diet low in fat and sugar / carbohydrates.  Inactivity, poor dietary choices and obesity can cause diabetes, heart attack, stroke, and kidney damage, among others.     ALCOHOL AND SMOKING:  Women should limit their alcohol intake to no  more than 7 drinks/beers/glasses of wine (combined, not each!) per week. Moderation of alcohol intake to this level decreases your risk of breast cancer and liver damage.  ( And of course, no recreational drugs are part of a healthy lifestyle.)  Also, you should not be smoking at all or even being exposed to second hand smoke. Most people know smoking can  cause cancer, and various heart and lung diseases, but did you know it also contributes to weakening of your bones?  Aging of your skin?  Yellowing of your teeth and nails?   CALCIUM AND VITAMIN D:  Adequate intake of calcium and Vitamin D are recommended.  The recommendations for exact amounts of these supplements seem to change often, but generally speaking 600 mg of calcium (either carbonate or citrate) and 800 units of Vitamin D per day seems prudent. Certain women may benefit from higher intake of Vitamin D.  If you are among these women, your doctor will have told you during your visit.     PAP SMEARS:  Pap smears, to check for cervical cancer or precancers,  have traditionally been done yearly, although recent scientific advances have shown that most women can have pap smears less often.  However, every woman still should have a physical exam from her gynecologist or primary care physician every year. It will include a breast check, inspection of the vulva and vagina to check for abnormal growths or skin changes, a visual exam of the cervix, and then an exam to evaluate the size and shape of the uterus and ovaries.  And after 61 years of age, a rectal exam is indicated to check for rectal cancers. We will also provide age appropriate advice regarding health maintenance, like when you should have certain vaccines, screening for sexually transmitted diseases, bone density testing, colonoscopy, mammograms, etc.    MAMMOGRAMS:  All women over 35 years old should have a yearly mammogram. Many facilities now offer a "3D" mammogram, which may cost  around $50 extra out of pocket. If possible,  we recommend you accept the option to have the 3D mammogram performed.  It both reduces the number of women who will be called back for extra views which then turn out to be normal, and it is better than the routine mammogram at detecting truly abnormal areas.     COLONOSCOPY:  Colonoscopy to screen for colon cancer is recommended for all women at age 39.  We know, you hate the idea of the prep.  We agree, BUT, having colon cancer and not knowing it is worse!!  Colon cancer so often starts as a polyp that can be seen and removed at colonscopy, which can quite literally save your life!  And if your first colonoscopy is normal and you have no family history of colon cancer, most women don't have to have it again for 10 years.  Once every ten years, you can do something that may end up saving your life, right?  We will be happy to help you get it scheduled when you are ready.  Be sure to check your insurance coverage so you understand how much it will cost.  It may be covered as a preventative service at no cost, but you should check your particular policy.    Increase water intake, strive for at least half of your body weight in ounces per day.   Follow Heart Healthy diet Increase regular exercise.  Recommend at least 30 minutes daily, 5 days per week of walking, biking, swimming, YouTube/Pinterest workout videos. We will contact you with lab results. Continue to social distance and wear a mask when in public. Basic information for mRNA COVID-19 vaccines Please note that these CDC guidelines can change from week to week, and even day to day.  Refer to the [facility] website for the most current recommendations for any given day at [email or web address]  1) Under the EUAs (emergency use authorization), the following age groups are authorized to receive the COVID-19 vaccination: . Pfizer-BioNTech: ages ?16 years . Moderna: ages ?18  years . Children and adolescents outside of these authorized age groups should not receive COVID-19 vaccination at this time   2) You are not eligible to receive a COVID-19 vaccine if: . You have received another vaccine in the last 14-days (example: flu shot, shingles, tetanus, etc.) . You currently have a positive test for COVID-19. The vaccine must be deferred until you have recovered from the acute illness and the criteria has been met for you to discontinue isolation. . You have received passive antibody therapy (monoclonal antibodies or convalescent serum) as treatment for COVID-19 within the past 90 days   3) Currently COVID-19 vaccines are contraindicated in patients who have had an anaphylactic reaction to polyethylene glycol ( ie. GoLYTELY bowel prep or MiraLAX ) or polysorbate products (often used as a thickening agent for many ice creams, frozen custard and whipped dessert products etc).   Also, if you had an anaphylactic reaction to your first COVID-19 vaccination, you cannot receive your second.     - Also if you are currently pregnant please check with your obstetrician to see what their recommendations are regarding vaccination.   - Also, if you have a chronic condition such as CLL, RA or other immunocompromising conditions, please know that no data are currently available (or very limited data) on the safety and efficacy of mRNA COVID-19 vaccines in persons with autoimmune/ immunocompromising conditions.   Please seek the advice of your specialist regarding whether or not you should receive your COVID-19 vaccination.   Follow-up with primary care in 6 months.  It has been wonderful working with you at [facility]! GREAT TO SEE YOU!

## 2019-11-01 NOTE — Assessment & Plan Note (Signed)
Increase water intake, strive for at least half of your body weight in ounces per day.   Follow Heart Healthy diet Increase regular exercise.  Recommend at least 30 minutes daily, 5 days per week of walking, biking, swimming, YouTube/Pinterest workout videos. We will contact you with lab results. Continue to social distance and wear a mask when in public. Basic information for mRNA COVID-19 vaccines Please note that these CDC guidelines can change from week to week, and even day to day.  Refer to the [facility] website for the most current recommendations for any given day at [email or web address]   1) Under the EUAs (emergency use authorization), the following age groups are authorized to receive the COVID-19 vaccination: . Pfizer-BioNTech: ages ?16 years . Moderna: ages ?18 years . Children and adolescents outside of these authorized age groups should not receive COVID-19 vaccination at this time   2) You are not eligible to receive a COVID-19 vaccine if: . You have received another vaccine in the last 14-days (example: flu shot, shingles, tetanus, etc.) . You currently have a positive test for COVID-19. The vaccine must be deferred until you have recovered from the acute illness and the criteria has been met for you to discontinue isolation. . You have received passive antibody therapy (monoclonal antibodies or convalescent serum) as treatment for COVID-19 within the past 90 days   3) Currently COVID-19 vaccines are contraindicated in patients who have had an anaphylactic reaction to polyethylene glycol ( ie. GoLYTELY bowel prep or MiraLAX ) or polysorbate products (often used as a thickening agent for many ice creams, frozen custard and whipped dessert products etc).   Also, if you had an anaphylactic reaction to your first COVID-19 vaccination, you cannot receive your second.     - Also if you are currently pregnant please check with your obstetrician to see what their  recommendations are regarding vaccination.   - Also, if you have a chronic condition such as CLL, RA or other immunocompromising conditions, please know that no data are currently available (or very limited data) on the safety and efficacy of mRNA COVID-19 vaccines in persons with autoimmune/ immunocompromising conditions.   Please seek the advice of your specialist regarding whether or not you should receive your COVID-19 vaccination.   Follow-up with primary care in 6 months.  It has been wonderful working with you at [facility]!

## 2019-11-02 LAB — COMPREHENSIVE METABOLIC PANEL
ALT: 50 IU/L — ABNORMAL HIGH (ref 0–32)
AST: 34 IU/L (ref 0–40)
Albumin/Globulin Ratio: 2 (ref 1.2–2.2)
Albumin: 4.7 g/dL (ref 3.8–4.9)
Alkaline Phosphatase: 91 IU/L (ref 39–117)
BUN/Creatinine Ratio: 17 (ref 12–28)
BUN: 16 mg/dL (ref 8–27)
Bilirubin Total: 0.4 mg/dL (ref 0.0–1.2)
CO2: 22 mmol/L (ref 20–29)
Calcium: 9.7 mg/dL (ref 8.7–10.3)
Chloride: 105 mmol/L (ref 96–106)
Creatinine, Ser: 0.93 mg/dL (ref 0.57–1.00)
GFR calc Af Amer: 77 mL/min/{1.73_m2} (ref >59)
GFR calc non Af Amer: 67 mL/min/{1.73_m2} (ref >59)
Globulin, Total: 2.3 g/dL (ref 1.5–4.5)
Glucose: 90 mg/dL (ref 65–99)
Potassium: 4.3 mmol/L (ref 3.5–5.2)
Sodium: 141 mmol/L (ref 134–144)
Total Protein: 7 g/dL (ref 6.0–8.5)

## 2019-11-02 LAB — CBC WITH DIFFERENTIAL/PLATELET
Basophils Absolute: 0.1 10*3/uL (ref 0.0–0.2)
Basos: 1 %
EOS (ABSOLUTE): 0.3 10*3/uL (ref 0.0–0.4)
Eos: 5 %
Hematocrit: 42.4 % (ref 34.0–46.6)
Hemoglobin: 14.3 g/dL (ref 11.1–15.9)
Immature Grans (Abs): 0 10*3/uL (ref 0.0–0.1)
Immature Granulocytes: 1 %
Lymphocytes Absolute: 1.8 10*3/uL (ref 0.7–3.1)
Lymphs: 33 %
MCH: 30.4 pg (ref 26.6–33.0)
MCHC: 33.7 g/dL (ref 31.5–35.7)
MCV: 90 fL (ref 79–97)
Monocytes Absolute: 0.5 10*3/uL (ref 0.1–0.9)
Monocytes: 9 %
Neutrophils Absolute: 2.7 10*3/uL (ref 1.4–7.0)
Neutrophils: 51 %
Platelets: 260 10*3/uL (ref 150–450)
RBC: 4.7 x10E6/uL (ref 3.77–5.28)
RDW: 12.8 % (ref 11.7–15.4)
WBC: 5.3 10*3/uL (ref 3.4–10.8)

## 2019-11-02 LAB — TSH: TSH: 3.22 u[IU]/mL (ref 0.450–4.500)

## 2019-11-02 LAB — LIPID PANEL
Chol/HDL Ratio: 7.4 ratio — ABNORMAL HIGH (ref 0.0–4.4)
Cholesterol, Total: 243 mg/dL — ABNORMAL HIGH (ref 100–199)
HDL: 33 mg/dL — ABNORMAL LOW (ref >39)
LDL Chol Calc (NIH): 174 mg/dL — ABNORMAL HIGH (ref 0–99)
Triglycerides: 192 mg/dL — ABNORMAL HIGH (ref 0–149)
VLDL Cholesterol Cal: 36 mg/dL (ref 5–40)

## 2019-11-02 LAB — T4, FREE: Free T4: 0.82 ng/dL (ref 0.82–1.77)

## 2019-11-02 LAB — HEMOGLOBIN A1C
Est. average glucose Bld gHb Est-mCnc: 120 mg/dL
Hgb A1c MFr Bld: 5.8 % — ABNORMAL HIGH (ref 4.8–5.6)

## 2019-11-02 LAB — HEPATITIS C ANTIBODY: Hep C Virus Ab: <0.1 s/co ratio (ref 0.0–0.9)

## 2019-11-02 LAB — HIV ANTIBODY (ROUTINE TESTING W REFLEX): HIV Screen 4th Generation wRfx: NONREACTIVE

## 2019-11-08 ENCOUNTER — Ambulatory Visit: Payer: 59 | Admitting: Psychology

## 2019-11-15 ENCOUNTER — Ambulatory Visit: Payer: 59 | Admitting: Psychology

## 2019-11-20 ENCOUNTER — Encounter: Payer: Self-pay | Admitting: Gastroenterology

## 2019-11-22 ENCOUNTER — Ambulatory Visit: Payer: 59 | Admitting: Psychology

## 2019-11-22 NOTE — Progress Notes (Signed)
Received Epic notification that pt has not read MyChart message regarding results.     Recent lab results  From  Fonnie Mu, CMA To  [place] and Delivered  11/02/2019 11:53 AM  Barbara [name],   Barbara [name] has reviewed your recent lab results and asked that I inform you that your HIV test is negative. Your thyroid tests, complete blood count, and metabolic panel are all stable. Your A1c (3 month average of blood sugars) is 5.8, which puts you in the pre-diabetic range. Please reduce your carbohydrate and sugar intake. Barbara [name] recommends re-checking this test in 3 months.   Also, your cholesterol panel showed that all of your levels are quite elevated except your HDL (good cholesterol) is low. This number should be above 40 and yours is 33. Barbara [name] recommends that you start atorvastatin. If agreeable to starting this medication, please let us know so that we can send in a prescription for you. If you are willing to start this medication, you will need to have your cholesterol panel and liver function tests checked in 6 weeks.   Please respond to this message regarding a prescription for the atorvastatin.   Wishing you well,  Barbara [name], CMA for  Barbara Marble, NP      [address]  MyChart User Last Read On  Barbara [name] Not Read       Letter mailed to pt.  Barbara [name], CMA

## 2019-11-29 ENCOUNTER — Ambulatory Visit: Payer: 59 | Admitting: Psychology

## 2019-12-03 ENCOUNTER — Other Ambulatory Visit: Payer: Self-pay

## 2019-12-03 ENCOUNTER — Ambulatory Visit
Admission: RE | Admit: 2019-12-03 | Discharge: 2019-12-03 | Disposition: A | Discharge status: Home / Self Care | Payer: 59 | Source: Ambulatory Visit | Attending: Family Medicine | Admitting: Family Medicine

## 2019-12-03 DIAGNOSIS — Z1231 Encounter for screening mammogram for malignant neoplasm of breast: Secondary | ICD-10-CM

## 2019-12-06 ENCOUNTER — Ambulatory Visit: Payer: 59 | Admitting: Psychology

## 2019-12-10 ENCOUNTER — Ambulatory Visit (AMBULATORY_SURGERY_CENTER): Payer: Self-pay | Admitting: *Deleted

## 2019-12-10 ENCOUNTER — Other Ambulatory Visit: Payer: Self-pay

## 2019-12-10 VITALS — Temp 96.2°F | Ht 63.5 in | Wt 135.0 lb

## 2019-12-10 DIAGNOSIS — Z01818 Encounter for other preprocedural examination: Secondary | ICD-10-CM

## 2019-12-10 DIAGNOSIS — Z1211 Encounter for screening for malignant neoplasm of colon: Secondary | ICD-10-CM

## 2019-12-10 MED ORDER — SUPREP BOWEL PREP KIT 17.5-3.13-1.6 GM/177ML PO SOLN: 1.0000 | Freq: Once | ORAL | 0 refills | Status: AC

## 2019-12-10 NOTE — Progress Notes (Signed)
Egg cause nausea- can eat cakes , pies or foods that contain eggs no issues  or soy allergy known to patient  No issues with past sedation with any surgeries  or procedures, no intubation problems  No diet pills per patient No home 02 use per patient  No blood thinners per patient  Pt denies issues with constipation  No A fib or A flutter  EMMI video sent to pt's e mail   Due to the COVID-19 pandemic we are asking patients to follow these guidelines. Please only bring one care partner. Please be aware that your care partner may wait in the car in the parking lot or if they feel like they will be too hot to wait in the car, they may wait in the lobby on the 4th floor. All care partners are required to wear a mask the entire time (we do not have any that we can provide them), they need to practice social distancing, and we will do a Covid check for all patient's and care partners when you arrive. Also we will check their temperature and your temperature. If the care partner waits in their car they need to stay in the parking lot the entire time and we will call them on their cell phone when the patient is ready for discharge so they can bring the car to the front of the building. Also all patient's will need to wear a mask into building. Suprep Code to pharm and copupon to pt inPV today

## 2019-12-13 ENCOUNTER — Ambulatory Visit: Payer: 59 | Admitting: Psychology

## 2019-12-18 ENCOUNTER — Ambulatory Visit (INDEPENDENT_AMBULATORY_CARE_PROVIDER_SITE_OTHER): Payer: 59 | Admitting: Family Medicine

## 2019-12-18 ENCOUNTER — Encounter: Payer: Self-pay | Admitting: Family Medicine

## 2019-12-18 ENCOUNTER — Other Ambulatory Visit: Payer: Self-pay

## 2019-12-18 VITALS — BP 94/55 | HR 74 | Temp 98.1°F | Ht 63.5 in | Wt 138.4 lb

## 2019-12-18 DIAGNOSIS — R7401 Elevation of levels of liver transaminase levels: Secondary | ICD-10-CM

## 2019-12-18 DIAGNOSIS — E781 Pure hyperglyceridemia: Secondary | ICD-10-CM | POA: Diagnosis not present

## 2019-12-18 DIAGNOSIS — E785 Hyperlipidemia, unspecified: Secondary | ICD-10-CM | POA: Diagnosis not present

## 2019-12-18 DIAGNOSIS — R7303 Prediabetes: Secondary | ICD-10-CM | POA: Diagnosis not present

## 2019-12-18 DIAGNOSIS — E786 Lipoprotein deficiency: Secondary | ICD-10-CM

## 2019-12-18 NOTE — Patient Instructions (Signed)
Guidelines for a Low Cholesterol, Low Saturated Fat Diet   Fats - Limit total intake of fats and oils. - Avoid butter, stick margarine, shortening, lard, palm and coconut oils. - Limit mayonnaise, salad dressings, gravies and sauces, unless they are homemade with low-fat ingredients. - Limit chocolate. - Choose low-fat and nonfat products, such as low-fat mayonnaise, low-fat or non-hydrogenated peanut butter, low-fat or fat-free salad dressings and nonfat gravy. - Use vegetable oil, such as canola or olive oil. - Look for margarine that does not contain trans fatty acids. - Use nuts in moderate amounts. - Read ingredient labels carefully to determine both amount and type of fat present in foods. Limit saturated and trans fats! - Avoid high-fat processed and convenience foods.  Meats and Meat Alternatives - Choose fish, chicken, [place] and lean meats. - Use dried beans, peas, lentils and tofu. - Limit egg yolks to three to four per week. - If you eat red meat, limit to no more than three servings per week and choose loin or round cuts. - Avoid fatty meats, such as bacon, sausage, franks, luncheon meats and ribs. - Avoid all organ meats, including liver.  Dairy - Choose nonfat or low-fat milk, yogurt and cottage cheese. - Most cheeses are high in fat. Choose cheeses made from non-fat milk, such as mozzarella and ricotta cheese. - Choose light or fat-free cream cheese and sour cream. - Avoid cream and sauces made with cream.  Fruits and Vegetables - Eat a wide variety of fruits and vegetables. - Use lemon juice, vinegar or "mist" olive oil on vegetables. - Avoid adding sauces, fat or oil to vegetables.  Breads, Cereals and Grains - Choose whole-grain breads, cereals, pastas and rice. - Avoid high-fat snack foods, such as granola, cookies, pies, pastries, doughnuts and croissants.  Cooking Tips - Avoid deep fried foods. - Trim visible fat off meats and remove skin from poultry  before cooking. - Bake, broil, boil, poach or roast poultry, fish and lean meats. - Drain and discard fat that drains out of meat as you cook it. - Add little or no fat to foods. - Use vegetable oil sprays to grease pans for cooking or baking. - Steam vegetables. - Use herbs or no-oil marinades to flavor foods.     Risk factors for prediabetes and type 2 diabetes  Researchers don't fully understand why some people develop prediabetes and type 2 diabetes and others don't.  It's clear that certain factors increase the risk, however, including:  Weight. The more fatty tissue you have, the more resistant your cells become to insulin.  Inactivity. The less active you are, the greater your risk. Physical activity helps you control your weight, uses up glucose as energy and makes your cells more sensitive to insulin.  Family history. Your risk increases if a parent or sibling has type 2 diabetes.  Race. Although it's unclear why, people of certain races -- including blacks, Hispanics, American Indians and Asian-Americans -- are at higher risk.  Age. Your risk increases as you get older. This may be because you tend to exercise less, lose muscle mass and gain weight as you age. But type 2 diabetes is also increasing dramatically among children, adolescents and younger adults.  Gestational diabetes. If you developed gestational diabetes when you were pregnant, your risk of developing prediabetes and type 2 diabetes later increases. If you gave birth to a baby weighing more than 9 pounds (4 kilograms), you're also at risk of type 2 diabetes.  Polycystic ovary syndrome. For women, having polycystic ovary syndrome -- a common condition characterized by irregular menstrual periods, excess hair growth and obesity -- increases the risk of diabetes.  High blood pressure. Having blood pressure over 140/90 millimeters of mercury (mm Hg) is linked to an increased risk of type 2 diabetes.  Abnormal cholesterol  and triglyceride levels. If you have low levels of high-density lipoprotein (HDL), or "good," cholesterol, your risk of type 2 diabetes is higher. Triglycerides are another type of fat carried in the blood. People with high levels of triglycerides have an increased risk of type 2 diabetes. Your doctor can let you know what your cholesterol and triglyceride levels are.  A good guide to good carbs: The glycemic index ---If you have diabetes, or at risk for diabetes, you know all too well that when you eat carbohydrates, your blood sugar goes up. The total amount of carbs you consume at a meal or in a snack mostly determines what your blood sugar will do. But the food itself also plays a role. A serving of white rice has almost the same effect as eating pure table sugar -- a quick, high spike in blood sugar. A serving of lentils has a slower, smaller effect.  ---Picking good sources of carbs can help you control your blood sugar and your weight. Even if you don't have diabetes, eating healthier carbohydrate-rich foods can help ward off a host of chronic conditions, from heart disease to various cancers to, well, diabetes.  ---One way to choose foods is with the glycemic index (GI). This tool measures how much a food boosts blood sugar.  The glycemic index rates the effect of a specific amount of a food on blood sugar compared with the same amount of pure glucose. A food with a glycemic index of 28 boosts blood sugar only 28% as much as pure glucose. One with a GI of 95 acts like pure glucose.    High glycemic foods result in a quick spike in insulin and blood sugar (also known as blood glucose).  Low glycemic foods have a slower, smaller effect- these are healthier for you.   Using the glycemic index Using the glycemic index is easy: choose foods in the low GI category instead of those in the high GI category (see below), and go easy on those in between. Low glycemic index (GI of 55 or less): Most fruits  and vegetables, beans, minimally processed grains, pasta, low-fat dairy foods, and nuts.  Moderate glycemic index (GI 56 to 69): White and sweet potatoes, corn, white rice, couscous, breakfast cereals such as Cream of Wheat and Mini Wheats.  High glycemic index (GI of 70 or higher): White bread, rice cakes, most crackers, bagels, cakes, doughnuts, croissants, most packaged breakfast cereals. You can see the values for 100 commons foods and get links to more at www.health.CheapToothpicks.si.  Swaps for lowering glycemic index  Instead of this high-glycemic index food Eat this lower-glycemic index food  White rice Brown rice or converted rice  Instant oatmeal Steel-cut oats  Cornflakes Bran flakes  Baked potato Pasta, bulgur  White bread Whole-grain bread  Corn Peas or leafy greens       Prediabetes Eating Plan  Prediabetes--also called impaired glucose tolerance or impaired fasting glucose--is a condition that causes blood sugar (blood glucose) levels to be higher than normal. Following a healthy diet can help to keep prediabetes under control. It can also help to lower the risk of type 2 diabetes and heart  disease, which are increased in people who have prediabetes. Along with regular exercise, a healthy diet:  Promotes weight loss.  Helps to control blood sugar levels.  Helps to improve the way that the body uses insulin.   WHAT DO I NEED TO KNOW ABOUT THIS EATING PLAN?   Use the glycemic index (GI) to plan your meals. The index tells you how quickly a food will raise your blood sugar. Choose low-GI foods. These foods take a longer time to raise blood sugar.  Pay close attention to the amount of carbohydrates in the food that you eat. Carbohydrates increase blood sugar levels.  Keep track of how many calories you take in. Eating the right amount of calories will help you to achieve a healthy weight. Losing about 7 percent of your starting weight can help to prevent type 2  diabetes.  You may want to follow a Mediterranean diet. This diet includes a lot of vegetables, lean meats or fish, whole grains, fruits, and healthy oils and fats.   WHAT FOODS CAN I EAT?  Grains Whole grains, such as whole-wheat or whole-grain breads, crackers, cereals, and pasta. Unsweetened oatmeal. Bulgur. Barley. Quinoa. Brown rice. Corn or whole-wheat flour tortillas or taco shells. Vegetables Lettuce. Spinach. Peas. Beets. Cauliflower. Cabbage. Broccoli. Carrots. Tomatoes. Squash. Eggplant. Herbs. Peppers. Onions. Cucumbers. Brussels sprouts. Fruits Berries. Bananas. Apples. Oranges. Grapes. Papaya. Mango. Pomegranate. Kiwi. Grapefruit. Cherries. Meats and Other Protein Sources Seafood. Lean meats, such as chicken and [place] or lean cuts of pork and beef. Tofu. Eggs. Nuts. Beans. Dairy Low-fat or fat-free dairy products, such as yogurt, cottage cheese, and cheese. Beverages Water. Tea. Coffee. Sugar-free or diet soda. Seltzer water. Milk. Milk alternatives, such as soy or almond milk. Condiments Mustard. Relish. Low-fat, low-sugar ketchup. Low-fat, low-sugar barbecue sauce. Low-fat or fat-free mayonnaise. Sweets and Desserts Sugar-free or low-fat pudding. Sugar-free or low-fat ice cream and other frozen treats. Fats and Oils Avocado. Walnuts. Olive oil. The items listed above may not be a complete list of recommended foods or beverages. Contact your dietitian for more options.    WHAT FOODS ARE NOT RECOMMENDED?  Grains Refined white flour and flour products, such as bread, pasta, snack foods, and cereals. Beverages Sweetened drinks, such as sweet iced tea and soda. Sweets and Desserts Baked goods, such as cake, cupcakes, pastries, cookies, and cheesecake. The items listed above may not be a complete list of foods and beverages to avoid. Contact your dietitian for more information.   This information is not intended to replace advice given to you by your health care  provider. Make sure you discuss any questions you have with your health care provider.   Document Released: 01/21/2015 Document Reviewed: 01/21/2015 Elsevier Interactive Patient Education [employer].

## 2019-12-18 NOTE — Progress Notes (Signed)
Impression and Recommendations:    1. Prediabetes   2. Hyperlipidemia, unspecified hyperlipidemia type   3. Hypertriglyceridemia   4. Low HDL (under 40)   5. Elevated alanine aminotransferase (ALT) level     - Of note, this is my first time meeting patient.  Patient is new to me and was previously being cared for at our office by Mina Marble, NP, who no longer works at primary care [employer].    Mood Management - Stable at this time on current management. - Per patient, tolerating medication well without S-E. - Continue management as established.  See med list.  - In addition to prescription intervention, reviewed the "spokes of the wheel" of stress and mood management.  Stressed the importance of ongoing prudent habits, including regular exercise, appropriate sleep hygiene, healthful dietary habits, and prayer/meditation to relax.  - Will continue to monitor.   Prediabetes - since 05/31/2018 - Last A1c measured at 5.8 one month ago, up from 5.7 one year prior, and 5.7 two years prior.  - Counseled patient on prevention of diabetes and discussed dietary and lifestyle modification as first line.  Extensive education provided to patient today and all questions answered.    - Importance of low carb, heart-healthy diet discussed with patient in addition to regular aerobic exercise of 36mn 5d/week or more.   - Extensively reviewed glycemic index of foods and healthier habits for eating as a prediabetic. - Ambulatory referral provided today for prediabetic nutritionist.  See orders. - If patient has not heard about her referral by Friday morning, she knows to call TDorothea [facility]gle[facility] front desk.  - Handouts provided at patient's desire and or told to go online at the American Diabetes Association website for further information.  - We will continue to monitor and re-check as discussed.   Hyperlipidemia, Hypertriglyceridemia, Low HDL (under 40) - Last FLP obtained one month  ago:  Triglycerides = 192, elevated, up from 145 one year prior. HDL = 33, low, down from 39 one year prior. LDL = 174, elevated, down from 179 one year prior.  The 10-year ASCVD risk score (Mikey BussingDC Jr., et al., 2013) is: 3%  - Cholesterol levels are suboptimally controlled at this time.  - Reviewed that medications are not indicated at this time.  However, encouraged patient to engage in intensive lifestyle changes to help reduce health risks.  - Prudent dietary changes discussed, including a diet low in saturated & trans fat diets to help manage hyperlipidemia, and a diet low in carbohydrates to help manage hypertriglyceridemia.   - To help improve HDL, encouraged patient to follow AHA guidelines for regular exercise and also engage in weight loss if BMI above 25.   Educational handouts provided at patient's desire and/ or told to look online at the [employer] for further information.  We will continue to monitor and re-check as discussed.   Elevated alanine aminotransferase (ALT) level - Discussed that patient's ALT last check was elevated at 50.   - Per patient, does not take tylenol and does not drink alcoholic beverages.  - Advised patient to modify her diet to incorporate healthier foods.  - Encouraged patient to avoid all hepatotoxic substances.  - Will continue to monitor and re-check as discussed.   Health Counseling & Preventative Maintenance - Advised patient to continue working toward exercising to improve overall mental, physical, and emotional health.    - Encouraged patient to engage in daily physical activity, especially  a formal exercise routine.  Recommended that the patient eventually strive for at least 150 minutes of moderate cardiovascular activity per week according to guidelines established by the [facility].   - Healthy dietary habits encouraged, including low-carb, and high amounts of lean protein in diet.   - Patient should also  consume adequate amounts of water.  - Health counseling performed.  All questions answered.   Orders Placed This Encounter  Procedures  . Ambulatory referral to diabetic education    Please see AVS handed out to patient at the end of our visit for further patient instructions/ counseling done pertaining to today's office visit.   Return for labs in 6 mo, recheck A1c, FLP, and ALT, with OV one week later to discuss.     Note:  This note was prepared with assistance of Dragon voice recognition software. Occasional wrong-word or sound-a-like substitutions may have occurred due to the inherent limitations of voice recognition software.   The [place] was signed into law in 2016 which includes the topic of electronic health records.  This provides immediate access to information in MyChart.  This includes consultation notes, operative notes, office notes, lab results and pathology reports.  If you have any questions about what you read please let us know at your next visit or call us at the office.  We are right here with you.  This case required medical decision making of at least moderate complexity.  This document serves as a record of services personally performed by Mellody Dance, DO. It was created on her behalf by Toni Amend, a trained medical scribe. The creation of this record is based on the scribe's personal observations and the provider's statements to them.    The above documentation from Toni Amend, medical scribe, has been reviewed by Marjory Sneddon, D.O.      --------------------------------------------------------------------------------------------------------------------------------------------------------------------------------------------------------------------------------------------    Subjective:     Barbara [name], am serving as scribe for Dr.Estus Krakowski.   HPI: Barbara [name] is a 59 y.o. female who presents  to [place] [facility] today for issues as discussed below.  Per patient, reviewed her recent lab results on MyChart.  Notes "this time, it kind of worried me, and that's what I wanted to talk to you about, especially the diabetes and cholesterol."  - Prediabetes States she eats a lot of sugar, and always has.  She wonders if her numbers will improve if she cuts back on sugar.  Confirms family history of diabetes in several family members.  Reports that her grandfather had diabetes, and her mother has diabetes.  Patient's sister is five years younger than the patient, and also has diabetes.  In addition to this, reports that her grandfather drank and smoked, and her sister smoked.  The patient quit smoking 10 years ago, and used to smoke too.  - Exercise Habits Notes she does not exercise very much.  She and her husband have started taking walks down the block, and she is hoping to continue to cultivate this habit.  - Alcohol Use Notes she drinks no alcohol; "there's a reason I don't drink it."  States that her husband is an alcoholic, so they avoid alcohol entirely.  - Depression Managed on antidepressant.  She feels stable at this time and denies concerns with management.  "It's perfect, actually."  Quit cold [place] once in the past and notes she had "mini seizures" for a month and "never doing that again."  -  Eating Habits Believes her diet is the "biggest thing."  Thinks her main problem is sugar; "I can't stop, it's like I'm addicted."  Notes she has a bowl of ice cream every day; "not a huge bowl or anything, but a scoop of ice cream every day."  Notes she eats a lot of Little Debbies snacks, cookies, a piece of chocolate now and then; multiple snacks of sugary things per day.  States "I don't usually eat dinner; that's when I have my little scoop of ice cream."  Her main meal is at lunch, and feels "we do eat pretty good, healthy-wise."  States her husband is a great  cook; yesterday they had an orange pepper with [place], using the pepper instead of sandwich bread.  States she eats a lot of salads and typically uses vinaigrettes instead of ranch dressing.  She never drinks soda.  For breakfast, she typically has One bars.  Notes she doesn't eat fast food, "hardly ever," but does eat a lot of packaged foods.   States if she doesn't eat sugar, she can't concentrate, gets shaky, "like hypoglycemic feeling, and then as soon as I eat a candy bar, I'm okay."  She wonders if this occurs because she is addicted to sugar.  HPI:  Hyperlipidemia:  59 y.o. female here for cholesterol follow-up.   - Patient reports good compliance with treatment plan of:  medication and/ or lifestyle management.    - Patient denies any acute concerns or problems with management plan   - She denies new onset of: myalgias, arthralgias, increased fatigue more than normal, chest pains, exercise intolerance, shortness of breath, dizziness, visual changes, headache, lower extremity swelling or claudication.   Most recent cholesterol panel was:  Lab Results  Component Value Date   CHOL 243 (H) 11/01/2019   HDL 33 (L) 11/01/2019   LDLCALC 174 (H) 11/01/2019   TRIG 192 (H) 11/01/2019   CHOLHDL 7.4 (H) 11/01/2019   Hepatic Function Latest Ref Rng & Units 11/01/2019 05/31/2018 12/23/2016  Total Protein 6.0 - 8.5 g/dL 7.0 7.5 -  Albumin 3.8 - 4.9 g/dL 4.7 4.5 -  AST 0 - 40 IU/L 34 22 25  ALT 0 - 32 IU/L 50(H) 22 27  Alk Phosphatase 39 - 117 IU/L 91 85 78  Total Bilirubin 0.0 - 1.2 mg/dL 0.4 0.3 -    Last A1C in the office was:  Lab Results  Component Value Date   HGBA1C 5.8 (H) 11/01/2019   HGBA1C 5.7 (H) 05/31/2018   HGBA1C 5.7 12/23/2016   Lab Results  Component Value Date   LDLCALC 174 (H) 11/01/2019   CREATININE 0.93 11/01/2019    Wt Readings from Last 3 Encounters:  12/18/19 138 lb 6.4 oz (62.8 kg)  12/10/19 135 lb (61.2 kg)  11/01/19 138 lb 12.8 oz (63 kg)   BP  Readings from Last 3 Encounters:  12/18/19 (!) 94/55  11/01/19 97/63  11/08/18 (!) 99/51   Pulse Readings from Last 3 Encounters:  12/18/19 74  11/01/19 (!) 59  11/08/18 77   BMI Readings from Last 3 Encounters:  12/18/19 24.13 kg/m  12/10/19 23.54 kg/m  11/01/19 24.20 kg/m   Depression screen [facility] 2/9 12/18/2019 11/01/2019 08/01/2019  Decreased Interest 0 1 1  Down, Depressed, Hopeless 0 1 1  PHQ - 2 Score 0 2 2  Altered sleeping '1 1 3  ' Tired, decreased energy '1 1 1  ' Change in appetite 1 0 3  Feeling bad or failure about yourself  0 1 1  Trouble concentrating 0 0 1  Moving slowly or fidgety/restless 0 0 0  Suicidal thoughts 0 0 0  PHQ-9 Score '3 5 11  ' Difficult doing work/chores Not difficult at all Somewhat difficult Somewhat difficult   GAD 7 : Generalized Anxiety Score 08/01/2019  Nervous, Anxious, on Edge 1  Control/stop worrying 3  Worry too much - different things 3  Trouble relaxing 3  Restless 1  Easily annoyed or irritable 1  Afraid - awful might happen 0  Total GAD 7 Score 12  Anxiety Difficulty Somewhat difficult      Patient Care Team    Relationship Specialty Notifications Start End  Esaw Grandchild, NP PCP - General Family Medicine  04/17/18      Patient Active Problem List   Diagnosis Date Noted  . Prediabetes 12/18/2019  . Hyperlipidemia 12/18/2019  . Hypertriglyceridemia 12/18/2019  . Low HDL (under 40) 12/18/2019  . Elevated alanine aminotransferase (ALT) level 12/18/2019  . GAD (generalized anxiety disorder) 11/08/2018  . Depression, major, single episode, moderate ([place]) 10/11/2018  . Eczema 10/11/2018  . Insomnia 10/11/2018  . Abnormal CBC 06/12/2018  . Low blood pressure reading 06/12/2018  . Healthcare maintenance 05/18/2018  . Screening for breast cancer 05/18/2018  . Axillary pain, right 05/18/2018    Past Medical history, Surgical history, Family history, Social history, Allergies and Medications have been entered into the  medical record, reviewed and changed as needed.    Current Meds  Medication Sig  . citalopram (CELEXA) 10 MG tablet 1 tablet by mouth daily  . diphenhydrAMINE (BENADRYL) 25 mg capsule Take 25 mg by mouth daily.    Allergies:  No Known Allergies   Review of Systems:  A fourteen system review of systems was performed and found to be positive as per HPI.   Objective:   Blood pressure (!) 94/55, pulse 74, temperature 98.1 F (36.7 C), temperature source Oral, height 5' 3.5" (1.613 m), weight 138 lb 6.4 oz (62.8 kg), SpO2 97 %. Body mass index is 24.13 kg/m. General:  Well Developed, well nourished, appropriate for stated age.  Neuro:  Alert and oriented,  extra-ocular muscles intact  HEENT:  Normocephalic, atraumatic, neck supple, no carotid bruits appreciated  Skin:  no gross rash, warm, pink. Cardiac:  RRR, S1 S2 Respiratory:  ECTA B/L and A/P, Not using accessory muscles, speaking in full sentences- unlabored. Vascular:  Ext warm, no cyanosis apprec.; cap RF less 2 sec. Psych:  No HI/SI, judgement and insight good, Euthymic mood. Full Affect.

## 2019-12-19 ENCOUNTER — Other Ambulatory Visit
Admission: RE | Admit: 2019-12-19 | Discharge: 2019-12-19 | Disposition: A | Discharge status: Home / Self Care | Payer: 59 | Source: Ambulatory Visit | Attending: Gastroenterology | Admitting: Gastroenterology

## 2019-12-20 ENCOUNTER — Encounter: Payer: Self-pay | Admitting: Gastroenterology

## 2019-12-20 ENCOUNTER — Ambulatory Visit: Payer: 59 | Admitting: Psychology

## 2019-12-24 ENCOUNTER — Ambulatory Visit (AMBULATORY_SURGERY_CENTER): Payer: 59 | Admitting: Gastroenterology

## 2019-12-24 ENCOUNTER — Encounter: Payer: Self-pay | Admitting: Gastroenterology

## 2019-12-24 ENCOUNTER — Other Ambulatory Visit: Payer: Self-pay

## 2019-12-24 VITALS — BP 97/48 | HR 70 | Temp 96.8°F | Resp 17 | Ht 63.5 in | Wt 135.0 lb

## 2019-12-24 DIAGNOSIS — Z1211 Encounter for screening for malignant neoplasm of colon: Secondary | ICD-10-CM | POA: Diagnosis present

## 2019-12-24 DIAGNOSIS — D123 Benign neoplasm of transverse colon: Secondary | ICD-10-CM | POA: Diagnosis not present

## 2019-12-24 DIAGNOSIS — D125 Benign neoplasm of sigmoid colon: Secondary | ICD-10-CM

## 2019-12-24 MED ORDER — SODIUM CHLORIDE 0.9 % IV SOLN: 500.0000 mL | Freq: Once | INTRAVENOUS | Status: DC

## 2019-12-24 NOTE — Progress Notes (Signed)
CW- vitals JB- temp 

## 2019-12-24 NOTE — Progress Notes (Signed)
A and O x3. Report to RN. Tolerated MAC anesthesia well.

## 2019-12-24 NOTE — Progress Notes (Signed)
Pt's states no medical or surgical changes since previsit or office visit. 

## 2019-12-24 NOTE — Op Note (Signed)
[name] Procedure Date: 12/24/2019 8:11 AM MRN: [id number] Endoscopist: Mauri Pole , MD Age: 59 Referring MD:  Date of Birth: 1961/06/12 Gender: Female Account #: [id number] Procedure:                Colonoscopy Indications:              Screening for colorectal malignant neoplasm Medicines:                Monitored Anesthesia Care Procedure:                Pre-Anesthesia Assessment:                           - Prior to the procedure, a History and Physical                            was performed, and patient medications and                            allergies were reviewed. The patient's tolerance of                            previous anesthesia was also reviewed. The risks                            and benefits of the procedure and the sedation                            options and risks were discussed with the patient.                            All questions were answered, and informed consent                            was obtained. Prior Anticoagulants: The patient has                            taken no previous anticoagulant or antiplatelet                            agents. ASA Grade Assessment: I - A normal, healthy                            patient. After reviewing the risks and benefits,                            the patient was deemed in satisfactory condition to                            undergo the procedure.                           After obtaining informed consent, the colonoscope  was passed under direct vision. Throughout the                            procedure, the patient's blood pressure, pulse, and                            oxygen saturations were monitored continuously. The                            Colonoscope was introduced through the anus and                            advanced to the the cecum, identified by                            appendiceal orifice and ileocecal  valve. The                            colonoscopy was performed without difficulty. The                            patient tolerated the procedure well. The quality                            of the bowel preparation was excellent. The                            ileocecal valve, appendiceal orifice, and rectum                            were photographed. Scope In: 8:28:39 AM Scope Out: 8:52:29 AM Scope Withdrawal Time: 0 hours 12 minutes 58 seconds  Total Procedure Duration: 0 hours 23 minutes 50 seconds  Findings:                 The perianal and digital rectal examinations were                            normal.                           A 4 mm polyp was found in the transverse colon. The                            polyp was sessile. The polyp was removed with a                            cold snare. Resection and retrieval were complete.                           A less than 1 mm polyp was found in the sigmoid                            colon. The polyp was sessile. The polyp was removed  with a cold biopsy forceps. Resection and retrieval                            were complete.                           Non-bleeding internal hemorrhoids were found during                            retroflexion. The hemorrhoids were small.                           The exam was otherwise without abnormality. Complications:            No immediate complications. Estimated Blood Loss:     Estimated blood loss was minimal. Impression:               - One 4 mm polyp in the transverse colon, removed                            with a cold snare. Resected and retrieved.                           - One less than 1 mm polyp in the sigmoid colon,                            removed with a cold biopsy forceps. Resected and                            retrieved.                           - Non-bleeding internal hemorrhoids.                           - The examination was otherwise  normal. Recommendation:           - Patient has a contact number available for                            emergencies. The signs and symptoms of potential                            delayed complications were discussed with the                            patient. Return to normal activities tomorrow.                            Written discharge instructions were provided to the                            patient.                           - Resume previous diet.                           -  Continue present medications.                           - Await pathology results.                           - Repeat colonoscopy in 5-10 years for surveillance                            based on pathology results. Mauri Pole, MD 12/24/2019 8:58:09 AM This report has been signed electronically.

## 2019-12-24 NOTE — Progress Notes (Signed)
Called to room to assist during endoscopic procedure.  Patient ID and intended procedure confirmed with present staff. Received instructions for my participation in the procedure from the performing physician.  

## 2019-12-24 NOTE — Patient Instructions (Signed)
YOU HAD AN ENDOSCOPIC PROCEDURE TODAY AT THE [facility]:   Refer to the procedure report that was given to you for any specific questions about what was found during the examination.  If the procedure report does not answer your questions, please call your gastroenterologist to clarify.  If you requested that your care partner not be given the details of your procedure findings, then the procedure report has been included in a sealed envelope for you to review at your convenience later.  YOU SHOULD EXPECT: Some feelings of bloating in the abdomen. Passage of more gas than usual.  Walking can help get rid of the air that was put into your GI tract during the procedure and reduce the bloating. If you had a lower endoscopy (such as a colonoscopy or flexible sigmoidoscopy) you may notice spotting of blood in your stool or on the toilet paper. If you underwent a bowel prep for your procedure, you may not have a normal bowel movement for a few days.  Please Note:  You might notice some irritation and congestion in your nose or some drainage.  This is from the oxygen used during your procedure.  There is no need for concern and it should clear up in a day or so.  SYMPTOMS TO REPORT IMMEDIATELY:   Following lower endoscopy (colonoscopy or flexible sigmoidoscopy):  Excessive amounts of blood in the stool  Significant tenderness or worsening of abdominal pains  Swelling of the abdomen that is new, acute  Fever of 100F or higher  For urgent or emergent issues, a gastroenterologist can be reached at any hour by calling [phone]. Do not use MyChart messaging for urgent concerns.    DIET:  We do recommend a small meal at first, but then you may proceed to your regular diet.  Drink plenty of fluids but you should avoid alcoholic beverages for 24 hours.  ACTIVITY:  You should plan to take it easy for the rest of today and you should NOT DRIVE or use heavy machinery until tomorrow (because  of the sedation medicines used during the test).    FOLLOW UP: Our staff will call the number listed on your records 48-72 hours following your procedure to check on you and address any questions or concerns that you may have regarding the information given to you following your procedure. If we do not reach you, we will leave a message.  We will attempt to reach you two times.  During this call, we will ask if you have developed any symptoms of COVID 19. If you develop any symptoms (ie: fever, flu-like symptoms, shortness of breath, cough etc.) before then, please call [phone].  If you test positive for Covid 19 in the 2 weeks post procedure, please call and report this information to us.    If any biopsies were taken you will be contacted by phone or by letter within the next 1-3 weeks.  Please call us at [phone] if you have not heard about the biopsies in 3 weeks.    SIGNATURES/CONFIDENTIALITY: You and/or your care partner have signed paperwork which will be entered into your electronic medical record.  These signatures attest to the fact that that the information above on your After Visit Summary has been reviewed and is understood.  Full responsibility of the confidentiality of this discharge information lies with you and/or your care-partner. 

## 2019-12-26 ENCOUNTER — Telehealth: Payer: Self-pay

## 2019-12-26 NOTE — Telephone Encounter (Signed)
  Follow up Call-  Call back number 12/24/2019  Post procedure Call Back phone  # [phone]  Permission to leave phone message Yes     Patient questions:  Do you have a fever, pain , or abdominal swelling? No. Pain Score  0 *  Have you tolerated food without any problems? Yes.    Have you been able to return to your normal activities? Yes.    Do you have any questions about your discharge instructions: Diet   No. Medications  No. Follow up visit  No.  Do you have questions or concerns about your Care? No.  Actions: * If pain score is 4 or above: No action needed, pain <4. 1. Have you developed a fever since your procedure? no  2.   Have you had an respiratory symptoms (SOB or cough) since your procedure? no  3.   Have you tested positive for COVID 19 since your procedure no  4.   Have you had any family members/close contacts diagnosed with the COVID 19 since your procedure?  no   If yes to any of these questions please route to Joylene John, RN and Erenest Rasher, RN

## 2019-12-26 NOTE — Telephone Encounter (Signed)
First attempt follow up call to pt, lm on vm 

## 2019-12-27 ENCOUNTER — Ambulatory Visit: Payer: 59 | Admitting: Psychology

## 2019-12-28 ENCOUNTER — Encounter: Payer: Self-pay | Admitting: Gastroenterology

## 2020-01-03 ENCOUNTER — Ambulatory Visit: Payer: 59 | Admitting: Psychology

## 2020-02-04 ENCOUNTER — Other Ambulatory Visit: Payer: Self-pay | Admitting: Adult Health

## 2020-04-30 ENCOUNTER — Ambulatory Visit: Payer: 59 | Admitting: Physician Assistant

## 2020-07-14 ENCOUNTER — Other Ambulatory Visit: Payer: Self-pay | Admitting: Physician Assistant

## 2020-07-23 ENCOUNTER — Other Ambulatory Visit: Payer: Self-pay

## 2020-07-23 ENCOUNTER — Encounter: Payer: Self-pay | Admitting: Physician Assistant

## 2020-07-23 ENCOUNTER — Ambulatory Visit (INDEPENDENT_AMBULATORY_CARE_PROVIDER_SITE_OTHER): Payer: 59 | Admitting: Physician Assistant

## 2020-07-23 VITALS — Temp 98.2°F | Ht 64.0 in | Wt 130.0 lb

## 2020-07-23 DIAGNOSIS — E785 Hyperlipidemia, unspecified: Secondary | ICD-10-CM | POA: Diagnosis not present

## 2020-07-23 DIAGNOSIS — F411 Generalized anxiety disorder: Secondary | ICD-10-CM | POA: Diagnosis not present

## 2020-07-23 DIAGNOSIS — F321 Major depressive disorder, single episode, moderate: Secondary | ICD-10-CM

## 2020-07-23 DIAGNOSIS — R7303 Prediabetes: Secondary | ICD-10-CM

## 2020-07-23 MED ORDER — CITALOPRAM HYDROBROMIDE 10 MG PO TABS: 10.0000 mg | ORAL_TABLET | Freq: Every day | ORAL | 1 refills | Status: DC

## 2020-07-23 NOTE — Progress Notes (Signed)
Telehealth office visit note for Barbara Reid, PA-C- at Primary Care at [facility]   I connected with current patient today by telephone and verified that I am speaking with the correct person   . Location of the patient: Home . Location of the provider: Office - This visit type was conducted due to national recommendations for restrictions regarding the COVID-19 Pandemic (e.g. social distancing) in an effort to limit this patient's exposure and mitigate transmission in our community.    - No physical exam could be performed with this format, beyond that communicated to [place] by the patient/ family members as noted.   - Additionally my office staff/ schedulers were to discuss with the patient that there may be a monetary charge related to this service, depending on their medical insurance.  My understanding is that patient understood and consented to proceed.     _________________________________________________________________________________   History of Present Illness: Patient calls to follow up on mood management. Has no acute concerns today. Taking medication as directed without issues. States Citalopram helps with keeping her mood balanced. Reports had no problems with mood in the past until menopause, when she started experiencing mood swings.   Prediabetes: Tries to watch sugar, which can be challenging. Walks daily.  HLD: Limits red meat and fried foods.     GAD 7 : Generalized Anxiety Score 08/01/2019  Nervous, Anxious, on Edge 1  Control/stop worrying 3  Worry too much - different things 3  Trouble relaxing 3  Restless 1  Easily annoyed or irritable 1  Afraid - awful might happen 0  Total GAD 7 Score 12  Anxiety Difficulty Somewhat difficult    Depression screen [facility] 2/9 07/23/2020 12/18/2019 11/01/2019 08/01/2019 11/08/2018  Decreased Interest 0 0 1 1 1   Down, Depressed, Hopeless 0 0 1 1 1   PHQ - 2 Score 0 0 2 2 2   Altered sleeping 0 1 1 3 2   Tired, decreased  energy 0 1 1 1 1   Change in appetite 0 1 0 3 1  Feeling bad or failure about yourself  1 0 1 1 1   Trouble concentrating 0 0 0 1 1  Moving slowly or fidgety/restless 0 0 0 0 1  Suicidal thoughts 0 0 0 0 0  PHQ-9 Score 1 3 5 11 9   Difficult doing work/chores Not difficult at all Not difficult at all Somewhat difficult Somewhat difficult Somewhat difficult      Impression and Recommendations:     1. Depression, major, single episode, moderate ([place])   2. Prediabetes   3. Hyperlipidemia, unspecified hyperlipidemia type   4. GAD (generalized anxiety disorder)     Major depression, GAD: -PHQ-9 score of 1, stable -Continue current medication regimen. Provided refill. -Encourage to continue with physical activity level. -Will continue to monitor.  Prediabetes: -Last A1c 5.8 -Recommend to follow a low carbohydrate and glucose diet. -Continue with walking regimen. -Plan to repeat A1c with CPE.  Hyperlipidemia: -Last lipid panel elevated. Patient prefers to manage with diet and lifestyle modifications.  -Follow a heart healthy diet and continue with walking. -Plan to repeat lipid panel with CPE.   - As part of my medical decision making, I reviewed the following data within the [place] History obtained from pt /family, CMA notes reviewed and incorporated if applicable, Labs reviewed, Radiograph/ tests reviewed if applicable and OV notes from prior OV's with me, as well as any other specialists she/he has seen since seeing  me last, were all reviewed and used in my medical decision making process today.    - Additionally, when appropriate, discussion had with patient regarding our treatment plan, and their biases/concerns about that plan were used in my medical decision making today.    - The patient agreed with the plan and demonstrated an understanding of the instructions.   No barriers to understanding were identified.     - The patient was advised to call back or  seek an in-person evaluation if the symptoms worsen or if the condition fails to improve as anticipated.   Return for CPE and FBW in 3-4 months .    No orders of the defined types were placed in this encounter.   Meds ordered this encounter  Medications  . citalopram (CELEXA) 10 MG tablet    Sig: Take 1 tablet (10 mg total) by mouth daily.    Dispense:  90 tablet    Refill:  1    Order Specific Question:   Supervising Provider    Answer:   Beatrice Lecher D [[id number]]    Medications Discontinued During This Encounter  Medication Reason  . citalopram (CELEXA) 10 MG tablet Reorder       Time spent on visit including pre-visit chart review and post-visit care was 10 minutes.      The [place] was signed into law in 2016 which includes the topic of electronic health records.  This provides immediate access to information in MyChart.  This includes consultation notes, operative notes, office notes, lab results and pathology reports.  If you have any questions about what you read please let us know at your next visit or call us at the office.  We are right here with you.  Note:  This note was prepared with assistance of Dragon voice recognition software. Occasional wrong-word or sound-a-like substitutions may have occurred due to the inherent limitations of voice recognition software.  __________________________________________________________________________________     Patient Care Team    Relationship Specialty Notifications Start End  Barbara [name], [place] PCP - General Physician Assistant  04/28/20      -Vitals obtained; medications/ allergies reconciled;  personal medical, social, Sx etc.histories were updated by CMA, reviewed by me and are reflected in chart   Patient Active Problem List   Diagnosis Date Noted  . Prediabetes 12/18/2019  . Hyperlipidemia 12/18/2019  . Hypertriglyceridemia 12/18/2019  . Low HDL (under 40) 12/18/2019  . Elevated  alanine aminotransferase (ALT) level 12/18/2019  . GAD (generalized anxiety disorder) 11/08/2018  . Depression, major, single episode, moderate ([place]) 10/11/2018  . Eczema 10/11/2018  . Insomnia 10/11/2018  . Abnormal CBC 06/12/2018  . Low blood pressure reading 06/12/2018  . Healthcare maintenance 05/18/2018  . Screening for breast cancer 05/18/2018  . Axillary pain, right 05/18/2018     Current Meds  Medication Sig  . citalopram (CELEXA) 10 MG tablet Take 1 tablet (10 mg total) by mouth daily.  . diphenhydrAMINE (BENADRYL) 25 mg capsule Take 25 mg by mouth daily.  . [DISCONTINUED] citalopram (CELEXA) 10 MG tablet TAKE 1 TABLET BY MOUTH DAILY     Allergies:  No Known Allergies   ROS:  See above HPI for pertinent positives and negatives   Objective:   Temperature 98.2 F (36.8 C), height 5\' 4"  (1.626 m), weight 130 lb (59 kg).  (if some vitals are omitted, this means that patient was UNABLE to obtain them even though they were asked to get them prior to  OV today.  They were asked to call us at their earliest convenience with these once obtained. ) General: A & O * 3; Pleasant, sounds in no acute distress;  Respiratory: speaking in full sentences, no conversational dyspnea Psych: insight appears good, mood- appears full

## 2021-01-06 ENCOUNTER — Ambulatory Visit: Payer: 59 | Admitting: Physician Assistant

## 2021-01-23 ENCOUNTER — Ambulatory Visit: Payer: 59 | Admitting: Physician Assistant

## 2021-01-30 ENCOUNTER — Encounter: Payer: Self-pay | Admitting: Physician Assistant

## 2021-01-30 ENCOUNTER — Ambulatory Visit (INDEPENDENT_AMBULATORY_CARE_PROVIDER_SITE_OTHER): Payer: 59 | Admitting: Physician Assistant

## 2021-01-30 ENCOUNTER — Other Ambulatory Visit: Payer: Self-pay

## 2021-01-30 VITALS — BP 111/69 | HR 65 | Temp 97.9°F | Ht 64.0 in | Wt 139.4 lb

## 2021-01-30 DIAGNOSIS — Z0000 Encounter for general adult medical examination without abnormal findings: Secondary | ICD-10-CM

## 2021-01-30 DIAGNOSIS — R7303 Prediabetes: Secondary | ICD-10-CM

## 2021-01-30 DIAGNOSIS — Z1231 Encounter for screening mammogram for malignant neoplasm of breast: Secondary | ICD-10-CM | POA: Diagnosis not present

## 2021-01-30 DIAGNOSIS — Z01419 Encounter for gynecological examination (general) (routine) without abnormal findings: Secondary | ICD-10-CM | POA: Diagnosis not present

## 2021-01-30 DIAGNOSIS — F321 Major depressive disorder, single episode, moderate: Secondary | ICD-10-CM

## 2021-01-30 DIAGNOSIS — L309 Dermatitis, unspecified: Secondary | ICD-10-CM

## 2021-01-30 DIAGNOSIS — E785 Hyperlipidemia, unspecified: Secondary | ICD-10-CM

## 2021-01-30 MED ORDER — DESONIDE 0.05 % EX CREA: TOPICAL_CREAM | Freq: Two times a day (BID) | CUTANEOUS | 0 refills | Status: DC

## 2021-01-30 MED ORDER — CITALOPRAM HYDROBROMIDE 10 MG PO TABS: 10.0000 mg | ORAL_TABLET | Freq: Every day | ORAL | 0 refills | Status: DC

## 2021-01-30 NOTE — Patient Instructions (Signed)
Preventive Care 62-62 Years Old, Female Preventive care refers to lifestyle choices and visits with your health care provider that can promote health and wellness. This includes:  A yearly physical exam. This is also called an annual wellness visit.  Regular dental and eye exams.  Immunizations.  Screening for certain conditions.  Healthy lifestyle choices, such as: ? Eating a healthy diet. ? Getting regular exercise. ? Not using drugs or products that contain nicotine and tobacco. ? Limiting alcohol use. What can I expect for my preventive care visit? Physical exam Your health care provider will check your:  Height and weight. These may be used to calculate your BMI (body mass index). BMI is a measurement that tells if you are at a healthy weight.  Heart rate and blood pressure.  Body temperature.  Skin for abnormal spots. Counseling Your health care provider may ask you questions about your:  Past medical problems.  Family's medical history.  Alcohol, tobacco, and drug use.  Emotional well-being.  Home life and relationship well-being.  Sexual activity.  Diet, exercise, and sleep habits.  Work and work Statistician.  Access to firearms.  Method of birth control.  Menstrual cycle.  Pregnancy history. What immunizations do I need? Vaccines are usually given at various ages, according to a schedule. Your health care provider will recommend vaccines for you based on your age, medical history, and lifestyle or other factors, such as travel or where you work.   What tests do I need? Blood tests  Lipid and cholesterol levels. These may be checked every 5 years, or more often if you are over 3 years old.  Hepatitis C test.  Hepatitis B test. Screening  Lung cancer screening. You may have this screening every year starting at age 73 if you have a 30-pack-year history of smoking and currently smoke or have quit within the past 15 years.  Colorectal cancer  screening. ? All adults should have this screening starting at age 52 and continuing until age 17. ? Your health care provider may recommend screening at age 49 if you are at increased risk. ? You will have tests every 1-10 years, depending on your results and the type of screening test.  Diabetes screening. ? This is done by checking your blood sugar (glucose) after you have not eaten for a while (fasting). ? You may have this done every 1-3 years.  Mammogram. ? This may be done every 1-2 years. ? Talk with your health care provider about when you should start having regular mammograms. This may depend on whether you have a family history of breast cancer.  BRCA-related cancer screening. This may be done if you have a family history of breast, ovarian, tubal, or peritoneal cancers.  Pelvic exam and Pap test. ? This may be done every 3 years starting at age 10. ? Starting at age 11, this may be done every 5 years if you have a Pap test in combination with an HPV test. Other tests  STD (sexually transmitted disease) testing, if you are at risk.  Bone density scan. This is done to screen for osteoporosis. You may have this scan if you are at high risk for osteoporosis. Talk with your health care provider about your test results, treatment options, and if necessary, the need for more tests. Follow these instructions at home: Eating and drinking  Eat a diet that includes fresh fruits and vegetables, whole grains, lean protein, and low-fat dairy products.  Take vitamin and mineral supplements  as recommended by your health care provider.  Do not drink alcohol if: ? Your health care provider tells you not to drink. ? You are pregnant, may be pregnant, or are planning to become pregnant.  If you drink alcohol: ? Limit how much you have to 0-1 drink a day. ? Be aware of how much alcohol is in your drink. In the [place] one drink equals one 12 oz bottle of beer (355 mL), one 5 oz glass of  wine (148 mL), or one 1 oz glass of hard liquor (44 mL).   Lifestyle  Take daily care of your teeth and gums. Brush your teeth every morning and night with fluoride toothpaste. Floss one time each day.  Stay active. Exercise for at least 30 minutes 5 or more days each week.  Do not use any products that contain nicotine or tobacco, such as cigarettes, e-cigarettes, and chewing tobacco. If you need help quitting, ask your health care provider.  Do not use drugs.  If you are sexually active, practice safe sex. Use a condom or other form of protection to prevent STIs (sexually transmitted infections).  If you do not wish to become pregnant, use a form of birth control. If you plan to become pregnant, see your health care provider for a prepregnancy visit.  If told by your health care provider, take low-dose aspirin daily starting at age 50.  Find healthy ways to cope with stress, such as: ? Meditation, yoga, or listening to music. ? Journaling. ? Talking to a trusted person. ? Spending time with friends and family. Safety  Always wear your seat belt while driving or riding in a vehicle.  Do not drive: ? If you have been drinking alcohol. Do not ride with someone who has been drinking. ? When you are tired or distracted. ? While texting.  Wear a helmet and other protective equipment during sports activities.  If you have firearms in your house, make sure you follow all gun safety procedures. What's next?  Visit your health care provider once a year for an annual wellness visit.  Ask your health care provider how often you should have your eyes and teeth checked.  Stay up to date on all vaccines. This information is not intended to replace advice given to you by your health care provider. Make sure you discuss any questions you have with your health care provider. Document Revised: 06/10/2020 Document Reviewed: 05/18/2018 Elsevier Patient Education  2021 [employer]  

## 2021-01-30 NOTE — Progress Notes (Signed)
Subjective:     Barbara [name] is a 60 y.o. female and is here for a comprehensive physical exam. The patient reports no problems.  Social History   Socioeconomic History  . Marital status: Married    Spouse name: Not on file  . Number of children: Not on file  . Years of education: Not on file  . Highest education level: Not on file  Occupational History  . Not on file  Tobacco Use  . Smoking status: Former Smoker    Packs/day: 0.75    Years: 30.00    Pack years: 22.50    Types: Cigarettes    Quit date: 09/21/2011    Years since quitting: 9.3  . Smokeless tobacco: Never Used  Vaping Use  . Vaping Use: Never used  Substance and Sexual Activity  . Alcohol use: Never  . Drug use: Never  . Sexual activity: Yes    Birth control/protection: Surgical  Other Topics Concern  . Not on file  Social History Narrative  . Not on file   Social Determinants of Health   Financial Resource Strain: Not on file  Food Insecurity: Not on file  Transportation Needs: Not on file  Physical Activity: Not on file  Stress: Not on file  Social Connections: Not on file  Intimate Partner Violence: Not on file   Health Maintenance  Topic Date Due  . PAP SMEAR-Modifier  Never done  . COVID-19 Vaccine (1) 02/15/2021 (Originally 08/03/1966)  . INFLUENZA VACCINE  04/20/2021  . TETANUS/TDAP  09/20/2021  . MAMMOGRAM  12/02/2021  . COLONOSCOPY (Pts 45-42yrs Insurance coverage will need to be confirmed)  12/24/2026  . Hepatitis C Screening  Completed  . HIV Screening  Completed  . HPV VACCINES  Aged Out    The following portions of the patient's history were reviewed and updated as appropriate: allergies, current medications, past family history, past medical history, past social history, past surgical history and problem list.  Review of Systems Pertinent items noted in HPI and remainder of comprehensive ROS otherwise negative.   Objective:    BP 111/69   Pulse 65   Temp 97.9 F  (36.6 C)   Ht 5\' 4"  (1.626 m)   Wt 139 lb 6.4 oz (63.2 kg)   LMP  (LMP Unknown)   SpO2 100%   BMI 23.93 kg/m  General appearance: alert, cooperative and no distress Head: Normocephalic, without obvious abnormality, atraumatic Eyes: conjunctivae/corneas clear. PERRL, EOM's intact. Fundi benign. Ears: normal TM's and external ear canals both ears Nose: Nares normal. Septum midline. Mucosa normal. No drainage or sinus tenderness. Throat: lips, mucosa, and tongue normal; teeth and gums normal Neck: no adenopathy, no carotid bruit, no JVD, supple, symmetrical, trachea midline and thyroid not enlarged, symmetric, no tenderness/mass/nodules Back: symmetric, no curvature. ROM normal. No CVA tenderness. Lungs: clear to auscultation bilaterally Heart: regular rate and rhythm, S1, S2 normal, no murmur, click, rub or gallop Abdomen: soft, non-tender; bowel sounds normal; no masses,  no organomegaly Extremities: extremities normal, atraumatic, no cyanosis or edema Pulses: 2+ and symmetric Skin: normal, mobility and turgor normal and no edema or eczema - face Lymph nodes: Cervical adenopathy: normal and Supraclavicular adenopathy: normal Neurologic: Grossly normal    Assessment:    Healthy female exam.    Plan:  -Will place referral to OB/GYN for female exam. -Will orders for mammogram and fasting labs. -UTD on colonoscopy, Tdap, Hep C and HIV screenings. Declined Shingrix. -Use topical corticosteroid for eczema lesions. Recommend  to use sunscreen. -Follow a heart healthy diet, stay as active as possible and well hydrated. -Patient has started to self taper from citalopram and advised to start taking half tablet of 10 mg. Recommend resuming medication if mood worsens or change. -Follow up in 6 months for HLD, mood   See After Visit Summary for Counseling Recommendations

## 2021-01-31 LAB — COMPREHENSIVE METABOLIC PANEL
ALT: 46 IU/L — ABNORMAL HIGH (ref 0–32)
AST: 39 IU/L (ref 0–40)
Albumin/Globulin Ratio: 1.7 (ref 1.2–2.2)
Albumin: 4.6 g/dL (ref 3.8–4.8)
Alkaline Phosphatase: 76 IU/L (ref 44–121)
BUN/Creatinine Ratio: 16 (ref 12–28)
BUN: 18 mg/dL (ref 8–27)
Bilirubin Total: 0.4 mg/dL (ref 0.0–1.2)
CO2: 21 mmol/L (ref 20–29)
Calcium: 9.8 mg/dL (ref 8.7–10.3)
Chloride: 103 mmol/L (ref 96–106)
Creatinine, Ser: 1.14 mg/dL — ABNORMAL HIGH (ref 0.57–1.00)
Globulin, Total: 2.7 g/dL (ref 1.5–4.5)
Glucose: 96 mg/dL (ref 65–99)
Potassium: 4.5 mmol/L (ref 3.5–5.2)
Sodium: 140 mmol/L (ref 134–144)
Total Protein: 7.3 g/dL (ref 6.0–8.5)
eGFR: 55 mL/min/{1.73_m2} — ABNORMAL LOW (ref >59)

## 2021-01-31 LAB — CBC
Hematocrit: 43.4 % (ref 34.0–46.6)
Hemoglobin: 14.5 g/dL (ref 11.1–15.9)
MCH: 31 pg (ref 26.6–33.0)
MCHC: 33.4 g/dL (ref 31.5–35.7)
MCV: 93 fL (ref 79–97)
Platelets: 284 10*3/uL (ref 150–450)
RBC: 4.68 x10E6/uL (ref 3.77–5.28)
RDW: 12.9 % (ref 11.7–15.4)
WBC: 6.9 10*3/uL (ref 3.4–10.8)

## 2021-01-31 LAB — LIPID PANEL
Chol/HDL Ratio: 6.4 ratio — ABNORMAL HIGH (ref 0.0–4.4)
Cholesterol, Total: 268 mg/dL — ABNORMAL HIGH (ref 100–199)
HDL: 42 mg/dL (ref >39)
LDL Chol Calc (NIH): 192 mg/dL — ABNORMAL HIGH (ref 0–99)
Triglycerides: 182 mg/dL — ABNORMAL HIGH (ref 0–149)
VLDL Cholesterol Cal: 34 mg/dL (ref 5–40)

## 2021-01-31 LAB — HEMOGLOBIN A1C
Est. average glucose Bld gHb Est-mCnc: 123 mg/dL
Hgb A1c MFr Bld: 5.9 % — ABNORMAL HIGH (ref 4.8–5.6)

## 2021-01-31 LAB — TSH: TSH: 2.99 u[IU]/mL (ref 0.450–4.500)

## 2021-02-03 ENCOUNTER — Telehealth: Payer: Self-pay

## 2021-02-04 ENCOUNTER — Telehealth: Payer: Self-pay

## 2021-02-05 ENCOUNTER — Telehealth: Payer: Self-pay

## 2021-05-04 ENCOUNTER — Other Ambulatory Visit: Payer: Self-pay | Admitting: Physician Assistant

## 2021-05-04 DIAGNOSIS — Z1231 Encounter for screening mammogram for malignant neoplasm of breast: Secondary | ICD-10-CM

## 2021-05-07 ENCOUNTER — Encounter: Payer: Self-pay | Admitting: Nurse Practitioner

## 2021-05-07 ENCOUNTER — Other Ambulatory Visit: Payer: Self-pay

## 2021-05-07 ENCOUNTER — Ambulatory Visit: Payer: 59 | Admitting: Nurse Practitioner

## 2021-05-07 ENCOUNTER — Ambulatory Visit
Admission: RE | Admit: 2021-05-07 | Discharge: 2021-05-07 | Disposition: A | Discharge status: Home / Self Care | Payer: 59 | Source: Ambulatory Visit | Attending: Physician Assistant | Admitting: Physician Assistant

## 2021-05-07 VITALS — BP 96/61 | HR 71 | Temp 98.1°F | Ht 63.5 in | Wt 135.4 lb

## 2021-05-07 DIAGNOSIS — R1011 Right upper quadrant pain: Secondary | ICD-10-CM | POA: Diagnosis not present

## 2021-05-07 DIAGNOSIS — R112 Nausea with vomiting, unspecified: Secondary | ICD-10-CM | POA: Diagnosis not present

## 2021-05-07 DIAGNOSIS — Z1388 Encounter for screening for disorder due to exposure to contaminants: Secondary | ICD-10-CM | POA: Diagnosis not present

## 2021-05-07 DIAGNOSIS — Z1231 Encounter for screening mammogram for malignant neoplasm of breast: Secondary | ICD-10-CM

## 2021-05-07 NOTE — Progress Notes (Signed)
Established Patient Office Visit  Subjective:  Patient ID: Barbara [name], female    DOB: 05/24/1961  Age: 60 y.o. MRN: [id number]  CC:  Chief Complaint  Patient presents with   Abdominal Pain   Nausea     HPI Barbara [name] presents for evaluation of digestive problems.  States that she has been having intermittent episodes of stomach pain, bloating, vomiting, and diarrhea.  She states that episodes will last for a few hours once they initiate is fine for several days.  She has not really noticed any specific triggers.  And during an episode nothing really makes them better.  She states that episodes have been intermittent for the last several months.  She feels like they are gradually becoming more frequent and lasting for longer periods of time.  She denies fever, chills, body aches or pains.  She denies headaches during these episodes.  States that family day after she has 1 of these episodes she tends to be very fatigued and feels wiped out.  After recovery she states she is to find her several days, like nothing happened.  Of note, patient does mention concern for heavy metal toxicity.  She has concern that heavy metal toxicity, whether intentional or unintentional, may be contributing to her symptoms.  Past Medical History:  Diagnosis Date   Allergy    Anxiety    Arthritis    Depression    Hyperlipidemia    no treatment    Pre-diabetes    no meds     Past Surgical History:  Procedure Laterality Date   GUM SURGERY     MANDIBLE FRACTURE SURGERY  1975   TONSILLECTOMY     TUBAL LIGATION     WISDOM TOOTH EXTRACTION      Family History  Problem Relation Age of Onset   Heart attack Father    Diabetes Sister    Heart attack Brother    Diabetes Brother    Heart attack Maternal Grandfather    Diabetes Maternal Grandfather    Cancer Paternal Grandmother 34       breast   Breast cancer Paternal Grandmother    Heart attack Paternal Grandfather    Colon cancer Neg  Hx    Colon polyps Neg Hx    Esophageal cancer Neg Hx    Stomach cancer Neg Hx    Rectal cancer Neg Hx     Social History   Socioeconomic History   Marital status: Married    Spouse name: Not on file   Number of children: Not on file   Years of education: Not on file   Highest education level: Not on file  Occupational History   Not on file  Tobacco Use   Smoking status: Former    Packs/day: 0.75    Years: 30.00    Pack years: 22.50    Types: Cigarettes    Quit date: 09/21/2011    Years since quitting: 9.6   Smokeless tobacco: Never  Vaping Use   Vaping Use: Never used  Substance and Sexual Activity   Alcohol use: Never   Drug use: Never   Sexual activity: Yes    Birth control/protection: Surgical  Other Topics Concern   Not on file  Social History Narrative   Not on file   Social Determinants of Health   Financial Resource Strain: Not on file  Food Insecurity: Not on file  Transportation Needs: Not on file  Physical Activity: Not on file  Stress:  Not on file  Social Connections: Not on file  Intimate Partner Violence: Not on file    Outpatient Medications Prior to Visit  Medication Sig Dispense Refill   desonide (DESOWEN) 0.05 % cream Apply topically 2 (two) times daily. 30 g 0   diphenhydrAMINE (BENADRYL) 25 mg capsule Take 25 mg by mouth daily.     citalopram (CELEXA) 10 MG tablet Take 1 tablet (10 mg total) by mouth daily. 30 tablet 0   No facility-administered medications prior to visit.    No Known Allergies  ROS Review of Systems  Constitutional:  Positive for appetite change and fatigue. Negative for activity change, chills and fever.  HENT:  Negative for congestion, postnasal drip, rhinorrhea, sinus pressure, sinus pain and sore throat.   Eyes: Negative.   Respiratory:  Negative for cough, chest tightness, shortness of breath and wheezing.   Cardiovascular:  Negative for chest pain and palpitations.  Gastrointestinal:  Positive for abdominal  pain, diarrhea, nausea and vomiting. Negative for constipation.       Patient states that she has been having intermittent episodes of abdominal pain.  Pain mostly in right upper quadrant and epigastric area of abdomen.  Episodes are associated with bloating, vomiting, and diarrhea.  They last for several hours and gradually resolve.  Endocrine: Negative for cold intolerance, heat intolerance, polydipsia and polyuria.  Musculoskeletal:  Negative for arthralgias, back pain and myalgias.  Skin:  Negative for rash.  Allergic/Immunologic: Negative.   Neurological:  Negative for dizziness, weakness and headaches.  Psychiatric/Behavioral:  The patient is nervous/anxious.      Objective:    Physical Exam Vitals and nursing note reviewed.  Constitutional:      Appearance: Normal appearance. She is well-developed.  HENT:     Head: Normocephalic and atraumatic.     Nose: Nose normal.     Mouth/Throat:     Mouth: Mucous membranes are moist.     Pharynx: Oropharynx is clear.  Eyes:     Extraocular Movements: Extraocular movements intact.     Conjunctiva/sclera: Conjunctivae normal.     Pupils: Pupils are equal, round, and reactive to light.  Cardiovascular:     Rate and Rhythm: Normal rate and regular rhythm.     Pulses: Normal pulses.     Heart sounds: Normal heart sounds.  Pulmonary:     Effort: Pulmonary effort is normal.     Breath sounds: Normal breath sounds.  Abdominal:     General: Abdomen is flat. Bowel sounds are normal. There is no distension.     Palpations: Abdomen is soft.     Tenderness: There is abdominal tenderness in the right upper quadrant and epigastric area. There is no right CVA tenderness, left CVA tenderness, guarding or rebound. Negative signs include Murphy's sign, Rovsing's sign and McBurney's sign.     Hernia: No hernia is present.     Comments: There is a soft, smooth area of enhanced tissue, potentially enlarged gallbladder or liver, which can be palpated in  the right upper quadrant of the abdomen, just below the rib cage.  Currently nontender.  Negative Murphy sign.  Musculoskeletal:        General: Normal range of motion.     Cervical back: Normal range of motion and neck supple.  Lymphadenopathy:     Cervical: No cervical adenopathy.  Skin:    General: Skin is warm and dry.     Capillary Refill: Capillary refill takes less than 2 seconds.  Neurological:  General: No focal deficit present.     Mental Status: She is alert and oriented to person, place, and time.  Psychiatric:        Mood and Affect: Mood normal.        Behavior: Behavior normal.        Thought Content: Thought content normal.        Judgment: Judgment normal.    Today's Vitals   05/07/21 1522  BP: 96/61  Pulse: 71  Temp: 98.1 F (36.7 C)  SpO2: 96%  Weight: 135 lb 6.4 oz (61.4 kg)  Height: 5' 3.5" (1.613 m)   Body mass index is 23.61 kg/m.  Wt Readings from Last 3 Encounters:  05/07/21 135 lb 6.4 oz (61.4 kg)  01/30/21 139 lb 6.4 oz (63.2 kg)  07/23/20 130 lb (59 kg)     Health Maintenance Due  Topic Date Due   COVID-19 Vaccine (1) Never done   PAP SMEAR-Modifier  Never done   Zoster Vaccines- Shingrix (1 of 2) Never done   INFLUENZA VACCINE  04/20/2021    There are no preventive care reminders to display for this patient.  Lab Results  Component Value Date   TSH 2.990 01/30/2021   Lab Results  Component Value Date   WBC 6.7 05/08/2021   HGB 13.7 05/08/2021   HCT 41.2 05/08/2021   MCV 93 05/08/2021   PLT 277 05/08/2021   Lab Results  Component Value Date   NA 142 05/08/2021   K 4.2 05/08/2021   CO2 25 05/08/2021   GLUCOSE 88 05/08/2021   BUN 14 05/08/2021   CREATININE 1.08 (H) 05/08/2021   BILITOT 0.4 01/30/2021   ALKPHOS 76 01/30/2021   AST 39 01/30/2021   ALT 46 (H) 01/30/2021   PROT 7.3 01/30/2021   ALBUMIN 4.6 01/30/2021   CALCIUM 9.3 05/08/2021   EGFR 58 (L) 05/08/2021   Lab Results  Component Value Date   CHOL 268  (H) 01/30/2021   Lab Results  Component Value Date   HDL 42 01/30/2021   Lab Results  Component Value Date   LDLCALC 192 (H) 01/30/2021   Lab Results  Component Value Date   TRIG 182 (H) 01/30/2021   Lab Results  Component Value Date   CHOLHDL 6.4 (H) 01/30/2021   Lab Results  Component Value Date   HGBA1C 5.9 (H) 01/30/2021      Assessment & Plan:  1. Right upper quadrant abdominal pain There is right upper quadrant abdominal tenderness which is intermittent in nature.  Episodes are accompanied by bloating, vomiting, diarrhea.  Question whether gallbladder or liver enlarged with palpation.  We will get ultrasound of right upper quadrant of the abdomen for further evaluation.  We will check CBC and CMP. - US Abdomen Limited RUQ (LIVER/GB); Future  2. Intractable vomiting with nausea, unspecified vomiting type Unclear etiology.  Patient not currently experiencing any symptoms.  Getting CBC and CMP and ultrasound of right upper quadrant of the abdomen for further evaluation.  3. Screening, poisoning, heavy metal There is also question of potential heavy metal toxicity.  We will do general screening and follow-up with patient in 2 weeks and discuss results.  Problem List Items Addressed This Visit       Digestive   Intractable vomiting     Other   Right upper quadrant abdominal pain - Primary   Relevant Orders   US Abdomen Limited RUQ (LIVER/GB)   Screening, poisoning, heavy metal    This note  was dictated using Systems analyst. Rapid proofreading was performed to expedite the delivery of the information. Despite proofreading, phonetic errors will occur which are common with this voice recognition software. Please take this into consideration. If there are any concerns, please contact our office.     Follow-up: Return in about 2 weeks (around 05/21/2021) for labs tomorrow - CBC, BMP, heavy metal toxicity screening .    Ronnell Freshwater, NP

## 2021-05-08 ENCOUNTER — Other Ambulatory Visit: Payer: Self-pay | Admitting: Nurse Practitioner

## 2021-05-08 ENCOUNTER — Other Ambulatory Visit: Payer: 59

## 2021-05-08 DIAGNOSIS — R1011 Right upper quadrant pain: Secondary | ICD-10-CM

## 2021-05-09 LAB — CBC
Hematocrit: 41.2 % (ref 34.0–46.6)
Hemoglobin: 13.7 g/dL (ref 11.1–15.9)
MCH: 31 pg (ref 26.6–33.0)
MCHC: 33.3 g/dL (ref 31.5–35.7)
MCV: 93 fL (ref 79–97)
Platelets: 277 10*3/uL (ref 150–450)
RBC: 4.42 x10E6/uL (ref 3.77–5.28)
RDW: 13.1 % (ref 11.7–15.4)
WBC: 6.7 10*3/uL (ref 3.4–10.8)

## 2021-05-09 LAB — BASIC METABOLIC PANEL
BUN/Creatinine Ratio: 13 (ref 12–28)
BUN: 14 mg/dL (ref 8–27)
CO2: 25 mmol/L (ref 20–29)
Calcium: 9.3 mg/dL (ref 8.7–10.3)
Chloride: 105 mmol/L (ref 96–106)
Creatinine, Ser: 1.08 mg/dL — ABNORMAL HIGH (ref 0.57–1.00)
Glucose: 88 mg/dL (ref 65–99)
Potassium: 4.2 mmol/L (ref 3.5–5.2)
Sodium: 142 mmol/L (ref 134–144)
eGFR: 58 mL/min/{1.73_m2} — ABNORMAL LOW (ref >59)

## 2021-05-09 NOTE — Progress Notes (Signed)
Cbc and bmp good.

## 2021-05-11 ENCOUNTER — Telehealth: Payer: Self-pay | Admitting: Physician Assistant

## 2021-05-11 ENCOUNTER — Other Ambulatory Visit: Payer: Self-pay | Admitting: Physician Assistant

## 2021-05-11 DIAGNOSIS — N6320 Unspecified lump in the left breast, unspecified quadrant: Secondary | ICD-10-CM

## 2021-05-11 DIAGNOSIS — Z1388 Encounter for screening for disorder due to exposure to contaminants: Secondary | ICD-10-CM | POA: Insufficient documentation

## 2021-05-11 DIAGNOSIS — R1011 Right upper quadrant pain: Secondary | ICD-10-CM | POA: Insufficient documentation

## 2021-05-11 DIAGNOSIS — R1110 Vomiting, unspecified: Secondary | ICD-10-CM | POA: Insufficient documentation

## 2021-05-11 NOTE — Telephone Encounter (Signed)
Mammogram ordered by our office. We have not received a fax from [place] in regards to this patient. Contacting the breast center

## 2021-05-11 NOTE — Telephone Encounter (Signed)
Patient is aware. AS, CMA

## 2021-05-11 NOTE — Telephone Encounter (Signed)
Order for diagnostic MM and L breast US has been placed. AS, CMA

## 2021-05-11 NOTE — Telephone Encounter (Signed)
Patient states there is a mass on her left breast found during her mammogram. Patient would like to schedule an appointment and said they would be contacting us. Please advise, thanks.

## 2021-05-12 ENCOUNTER — Ambulatory Visit
Admission: RE | Admit: 2021-05-12 | Discharge: 2021-05-12 | Disposition: A | Discharge status: Home / Self Care | Payer: 59 | Source: Ambulatory Visit | Attending: Physician Assistant | Admitting: Physician Assistant

## 2021-05-12 ENCOUNTER — Other Ambulatory Visit: Payer: Self-pay

## 2021-05-12 DIAGNOSIS — N6320 Unspecified lump in the left breast, unspecified quadrant: Secondary | ICD-10-CM

## 2021-05-13 ENCOUNTER — Encounter: Payer: Self-pay | Admitting: Nurse Practitioner

## 2021-05-13 ENCOUNTER — Telehealth: Payer: Self-pay | Admitting: Physician Assistant

## 2021-05-13 ENCOUNTER — Ambulatory Visit: Payer: 59 | Admitting: Nurse Practitioner

## 2021-05-13 VITALS — BP 127/70 | HR 100 | Temp 98.0°F | Resp 20 | Ht 63.5 in | Wt 134.5 lb

## 2021-05-13 DIAGNOSIS — Z1388 Encounter for screening for disorder due to exposure to contaminants: Secondary | ICD-10-CM | POA: Diagnosis not present

## 2021-05-13 DIAGNOSIS — R1011 Right upper quadrant pain: Secondary | ICD-10-CM

## 2021-05-13 NOTE — Telephone Encounter (Signed)
Attempted to contact patient back with no answer. Left msg for her to call back. AS, CMA

## 2021-05-13 NOTE — Telephone Encounter (Signed)
Patient is returning your call about lab results or test results.

## 2021-05-13 NOTE — Progress Notes (Signed)
Waiting on arsenic reflex

## 2021-05-13 NOTE — Progress Notes (Signed)
Established Patient Office Visit  Subjective:  Patient ID: Barbara [name], female    DOB: 1961/08/28  Age: 60 y.o. MRN: [id number]  CC:  Chief Complaint  Patient presents with   Follow-up    HPI [name] presents for follow-up of labs.  The patient was seen at most recent visit for intermittent episodes of bloating, upper abdominal pain, and diarrhea.  A CBC, BMP, and screening for heavy metal toxicity were obtained.  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  She had voiced concern about potential exposure to heavy metals causing her abdominal symptoms.  She states she does eat seafood.  Unsure of last consumption prior to labs.  States she has been feeling well since her most recent visit.  She denies new concerns or symptoms today.  She denies chest pain, chest pressure, or shortness of breath. She denies headaches or visual disturbances. She denies abdominal pain, nausea, vomiting, or changes in bowel or bladder habits.    Past Medical History:  Diagnosis Date   Allergy    Anxiety    Arthritis    Depression    Hyperlipidemia    no treatment    Pre-diabetes    no meds     Past Surgical History:  Procedure Laterality Date   GUM SURGERY     MANDIBLE FRACTURE SURGERY  1975   TONSILLECTOMY     TUBAL LIGATION     WISDOM TOOTH EXTRACTION      Family History  Problem Relation Age of Onset   Heart attack Father    Diabetes Sister    Heart attack Brother    Diabetes Brother    Heart attack Maternal Grandfather    Diabetes Maternal Grandfather    Cancer Paternal Grandmother 57       breast   Breast cancer Paternal Grandmother    Heart attack Paternal Grandfather    Colon cancer Neg Hx    Colon polyps Neg Hx    Esophageal cancer Neg Hx    Stomach cancer Neg Hx    Rectal cancer Neg Hx     Social History   Socioeconomic History   Marital status: Married    Spouse name: Not on file   Number of children: Not on file   Years of education: Not  on file   Highest education level: Not on file  Occupational History   Not on file  Tobacco Use   Smoking status: Former    Packs/day: 0.75    Years: 30.00    Pack years: 22.50    Types: Cigarettes    Quit date: 09/21/2011    Years since quitting: 9.6   Smokeless tobacco: Never  Vaping Use   Vaping Use: Never used  Substance and Sexual Activity   Alcohol use: Never   Drug use: Never   Sexual activity: Yes    Birth control/protection: Surgical  Other Topics Concern   Not on file  Social History Narrative   Not on file   Social Determinants of Health   Financial Resource Strain: Not on file  Food Insecurity: Not on file  Transportation Needs: Not on file  Physical Activity: Not on file  Stress: Not on file  Social Connections: Not on file  Intimate Partner Violence: Not on file    Outpatient Medications Prior to Visit  Medication Sig Dispense Refill   desonide (DESOWEN) 0.05 % cream Apply topically 2 (two) times daily. (Patient taking differently: Apply 1 application topically 2 (  two) times daily as needed (rash).) 30 g 0   diphenhydrAMINE (BENADRYL) 25 mg capsule Take 25 mg by mouth daily.     No facility-administered medications prior to visit.    No Known Allergies  ROS Review of Systems  Constitutional:  Positive for fatigue. Negative for activity change, appetite change, chills and fever.  HENT:  Negative for congestion, postnasal drip, rhinorrhea, sinus pressure, sinus pain and sore throat.   Eyes: Negative.   Respiratory:  Negative for cough, chest tightness, shortness of breath and wheezing.   Cardiovascular:  Negative for chest pain and palpitations.  Gastrointestinal:  Positive for abdominal pain, diarrhea, nausea and vomiting. Negative for constipation.       Intermittent episodes of abdominal pain.  Pain mostly in right upper quadrant and epigastric area of abdomen.  Episodes are associated with bloating, vomiting, and diarrhea.  They last for several  hours and gradually resolve.  She states she has not had any further episodes since her most recent visit.  Endocrine: Negative for cold intolerance, heat intolerance, polydipsia and polyuria.  Musculoskeletal:  Negative for arthralgias, back pain and myalgias.  Skin:  Negative for rash.  Allergic/Immunologic: Negative.   Neurological:  Negative for dizziness, weakness and headaches.  Psychiatric/Behavioral:  The patient is nervous/anxious.      Objective:    Physical Exam Vitals and nursing note reviewed.  Constitutional:      Appearance: Normal appearance. She is well-developed.  HENT:     Head: Normocephalic and atraumatic.     Nose: Nose normal.     Mouth/Throat:     Mouth: Mucous membranes are moist.  Eyes:     Extraocular Movements: Extraocular movements intact.     Conjunctiva/sclera: Conjunctivae normal.     Pupils: Pupils are equal, round, and reactive to light.  Cardiovascular:     Rate and Rhythm: Normal rate and regular rhythm.     Pulses: Normal pulses.     Heart sounds: Normal heart sounds.  Pulmonary:     Effort: Pulmonary effort is normal.     Breath sounds: Normal breath sounds.  Abdominal:     Palpations: Abdomen is soft.  Musculoskeletal:        General: Normal range of motion.     Cervical back: Normal range of motion and neck supple.  Lymphadenopathy:     Cervical: No cervical adenopathy.  Skin:    General: Skin is warm and dry.     Capillary Refill: Capillary refill takes less than 2 seconds.  Neurological:     General: No focal deficit present.     Mental Status: She is alert and oriented to person, place, and time.  Psychiatric:        Mood and Affect: Mood normal.        Behavior: Behavior normal.        Thought Content: Thought content normal.        Judgment: Judgment normal.    Today's Vitals   05/25/21 1221  BP: 127/70  Pulse: 100  Resp: 20  Temp: 98 F (36.7 C)  SpO2: 97%  Weight: 134 lb 8 oz (61 kg)  Height: 5' 3.5" (1.613 m)    Body mass index is 23.45 kg/m.   Wt Readings from Last 3 Encounters:  05/22/21 130 lb 4.8 oz (59.1 kg)  05/25/21 134 lb 8 oz (61 kg)  05/07/21 135 lb 6.4 oz (61.4 kg)     Health Maintenance Due  Topic Date Due  COVID-19 Vaccine (1) Never done   PAP SMEAR-Modifier  Never done   Zoster Vaccines- Shingrix (1 of 2) Never done   INFLUENZA VACCINE  Never done    There are no preventive care reminders to display for this patient.  Lab Results  Component Value Date   TSH 2.990 01/30/2021   Lab Results  Component Value Date   WBC 6.7 05/08/2021   HGB 13.7 05/08/2021   HCT 41.2 05/08/2021   MCV 93 05/08/2021   PLT 277 05/08/2021   Lab Results  Component Value Date   NA 142 05/08/2021   K 4.2 05/08/2021   CO2 25 05/08/2021   GLUCOSE 88 05/08/2021   BUN 14 05/08/2021   CREATININE 1.08 (H) 05/08/2021   BILITOT 0.4 01/30/2021   ALKPHOS 76 01/30/2021   AST 39 01/30/2021   ALT 46 (H) 01/30/2021   PROT 7.3 01/30/2021   ALBUMIN 4.6 01/30/2021   CALCIUM 9.3 05/08/2021   EGFR 58 (L) 05/08/2021   Lab Results  Component Value Date   CHOL 268 (H) 01/30/2021   Lab Results  Component Value Date   HDL 42 01/30/2021   Lab Results  Component Value Date   LDLCALC 192 (H) 01/30/2021   Lab Results  Component Value Date   TRIG 182 (H) 01/30/2021   Lab Results  Component Value Date   CHOLHDL 6.4 (H) 01/30/2021   Lab Results  Component Value Date   HGBA1C 5.9 (H) 01/30/2021      Assessment & Plan:  1. Right upper quadrant abdominal pain Patient states she has not had further symptoms since most recent visit.  She is scheduled to have ultrasound of the right upper quadrant of the abdomen September is, 2022.  2. Screening, poisoning, heavy metal Arsenic screening is positive though still waiting on reflex.  There is potential that elevated levels due to consumption of seafood.  Waiting on these results.  Renal functions mildly elevated though improved from most recent  checks.  We will continue to monitor.  CBC within normal limits.  Problem List Items Addressed This Visit       Other   Right upper quadrant abdominal pain - Primary   Screening, poisoning, heavy metal    This note was dictated using Dragon Voice Recognition Software. Rapid proofreading was performed to expedite the delivery of the information. Despite proofreading, phonetic errors will occur which are common with this voice recognition software. Please take this into consideration. If there are any concerns, please contact our office.    Follow-up: Return if symptoms worsen or fail to improve, for as scheduled.    Ronnell Freshwater, NP

## 2021-05-13 NOTE — Patient Instructions (Signed)
https://www.nhlbi.nih.gov/files/docs/public/heart/dash_brief.pdf">  DASH Eating Plan DASH stands for Dietary Approaches to Stop Hypertension. The DASH eating plan is a healthy eating plan that has been shown to: Reduce high blood pressure (hypertension). Reduce your risk for type 2 diabetes, heart disease, and stroke. Help with weight loss. What are tips for following this plan? Reading food labels Check food labels for the amount of salt (sodium) per serving. Choose foods with less than 5 percent of the Daily Value of sodium. Generally, foods with less than 300 milligrams (mg) of sodium per serving fit into this eating plan. To find whole grains, look for the word "whole" as the first word in the ingredient list. Shopping Buy products labeled as "low-sodium" or "no salt added." Buy fresh foods. Avoid canned foods and pre-made or frozen meals. Cooking Avoid adding salt when cooking. Use salt-free seasonings or herbs instead of table salt or sea salt. Check with your health care provider or pharmacist before using salt substitutes. Do not fry foods. Cook foods using healthy methods such as baking, boiling, grilling, roasting, and broiling instead. Cook with heart-healthy oils, such as olive, canola, avocado, soybean, or sunflower oil. Meal planning  Eat a balanced diet that includes: 4 or more servings of fruits and 4 or more servings of vegetables each day. Try to fill one-half of your plate with fruits and vegetables. 6-8 servings of whole grains each day. Less than 6 oz (170 g) of lean meat, poultry, or fish each day. A 3-oz (85-g) serving of meat is about the same size as a deck of cards. One egg equals 1 oz (28 g). 2-3 servings of low-fat dairy each day. One serving is 1 cup (237 mL). 1 serving of nuts, seeds, or beans 5 times each week. 2-3 servings of heart-healthy fats. Healthy fats called omega-3 fatty acids are found in foods such as walnuts, flaxseeds, fortified milks, and eggs.  These fats are also found in cold-water fish, such as sardines, salmon, and mackerel. Limit how much you eat of: Canned or prepackaged foods. Food that is high in trans fat, such as some fried foods. Food that is high in saturated fat, such as fatty meat. Desserts and other sweets, sugary drinks, and other foods with added sugar. Full-fat dairy products. Do not salt foods before eating. Do not eat more than 4 egg yolks a week. Try to eat at least 2 vegetarian meals a week. Eat more home-cooked food and less restaurant, buffet, and fast food.  Lifestyle When eating at a restaurant, ask that your food be prepared with less salt or no salt, if possible. If you drink alcohol: Limit how much you use to: 0-1 drink a day for women who are not pregnant. 0-2 drinks a day for men. Be aware of how much alcohol is in your drink. In the [place] one drink equals one 12 oz bottle of beer (355 mL), one 5 oz glass of wine (148 mL), or one 1 oz glass of hard liquor (44 mL). General information Avoid eating more than 2,300 mg of salt a day. If you have hypertension, you may need to reduce your sodium intake to 1,500 mg a day. Work with your health care provider to maintain a healthy body weight or to lose weight. Ask what an ideal weight is for you. Get at least 30 minutes of exercise that causes your heart to beat faster (aerobic exercise) most days of the week. Activities may include walking, swimming, or biking. Work with your health care provider   or dietitian to adjust your eating plan to your individual calorie needs. What foods should I eat? Fruits All fresh, dried, or frozen fruit. Canned fruit in natural juice (without addedsugar). Vegetables Fresh or frozen vegetables (raw, steamed, roasted, or grilled). Low-sodium or reduced-sodium tomato and vegetable juice. Low-sodium or reduced-sodium tomatosauce and tomato paste. Low-sodium or reduced-sodium canned vegetables. Grains Whole-grain or  whole-wheat bread. Whole-grain or whole-wheat pasta. Brown rice. Oatmeal. Quinoa. Bulgur. Whole-grain and low-sodium cereals. Pita bread.Low-fat, low-sodium crackers. Whole-wheat flour tortillas. Meats and other proteins Skinless chicken or turkey. Ground chicken or turkey. Pork with fat trimmed off. Fish and seafood. Egg whites. Dried beans, peas, or lentils. Unsalted nuts, nut butters, and seeds. Unsalted canned beans. Lean cuts of beef with fat trimmed off. Low-sodium, lean precooked or cured meat, such as sausages or meatloaves. Dairy Low-fat (1%) or fat-free (skim) milk. Reduced-fat, low-fat, or fat-free cheeses. Nonfat, low-sodium ricotta or cottage cheese. Low-fat or nonfatyogurt. Low-fat, low-sodium cheese. Fats and oils Soft margarine without trans fats. Vegetable oil. Reduced-fat, low-fat, or light mayonnaise and salad dressings (reduced-sodium). Canola, safflower, olive, avocado, soybean, andsunflower oils. Avocado. Seasonings and condiments Herbs. Spices. Seasoning mixes without salt. Other foods Unsalted popcorn and pretzels. Fat-free sweets. The items listed above may not be a complete list of foods and beverages you can eat. Contact a dietitian for more information. What foods should I avoid? Fruits Canned fruit in a light or heavy syrup. Fried fruit. Fruit in cream or buttersauce. Vegetables Creamed or fried vegetables. Vegetables in a cheese sauce. Regular canned vegetables (not low-sodium or reduced-sodium). Regular canned tomato sauce and paste (not low-sodium or reduced-sodium). Regular tomato and vegetable juice(not low-sodium or reduced-sodium). Pickles. Olives. Grains Baked goods made with fat, such as croissants, muffins, or some breads. Drypasta or rice meal packs. Meats and other proteins Fatty cuts of meat. Ribs. Fried meat. Bacon. Bologna, salami, and other precooked or cured meats, such as sausages or meat loaves. Fat from the back of a pig (fatback). Bratwurst.  Salted nuts and seeds. Canned beans with added salt. Canned orsmoked fish. Whole eggs or egg yolks. Chicken or turkey with skin. Dairy Whole or 2% milk, cream, and half-and-half. Whole or full-fat cream cheese. Whole-fat or sweetened yogurt. Full-fat cheese. Nondairy creamers. Whippedtoppings. Processed cheese and cheese spreads. Fats and oils Butter. Stick margarine. Lard. Shortening. Ghee. Bacon fat. Tropical oils, suchas coconut, palm kernel, or palm oil. Seasonings and condiments Onion salt, garlic salt, seasoned salt, table salt, and sea salt. Worcestershire sauce. Tartar sauce. Barbecue sauce. Teriyaki sauce. Soy sauce, including reduced-sodium. Steak sauce. Canned and packaged gravies. Fish sauce. Oyster sauce. Cocktail sauce. Store-bought horseradish. Ketchup. Mustard. Meat flavorings and tenderizers. Bouillon cubes. Hot sauces. Pre-made or packaged marinades. Pre-made or packaged taco seasonings. Relishes. Regular saladdressings. Other foods Salted popcorn and pretzels. The items listed above may not be a complete list of foods and beverages you should avoid. Contact a dietitian for more information. Where to find more information National Heart, Lung, and Blood Institute: www.nhlbi.nih.gov American Heart Association: www.heart.org Academy of Nutrition and Dietetics: www.eatright.org National Kidney Foundation: www.kidney.org Summary The DASH eating plan is a healthy eating plan that has been shown to reduce high blood pressure (hypertension). It may also reduce your risk for type 2 diabetes, heart disease, and stroke. When on the DASH eating plan, aim to eat more fresh fruits and vegetables, whole grains, lean proteins, low-fat dairy, and heart-healthy fats. With the DASH eating plan, you should limit salt (sodium) intake to 2,300   mg a day. If you have hypertension, you may need to reduce your sodium intake to 1,500 mg a day. Work with your health care provider or dietitian to adjust  your eating plan to your individual calorie needs. This information is not intended to replace advice given to you by your health care provider. Make sure you discuss any questions you have with your healthcare provider. Document Revised: 08/10/2019 Document Reviewed: 08/10/2019 Elsevier Patient Education  2022 Elsevier Inc.  

## 2021-05-13 NOTE — Telephone Encounter (Signed)
Patient coming in today to see Heather. AS, CMA

## 2021-05-14 ENCOUNTER — Telehealth: Payer: Self-pay | Admitting: Nurse Practitioner

## 2021-05-14 NOTE — Telephone Encounter (Signed)
Patient would like to know if her test results have came back, thanks.

## 2021-05-18 ENCOUNTER — Telehealth: Payer: Self-pay | Admitting: Nurse Practitioner

## 2021-05-18 NOTE — Telephone Encounter (Signed)
Patient called office asking if the results from recent labs were back. I advised patient all labs have not been resulted and that we will reach out once they have been resulted and reviewed by provider. Patient verbalized understanding. AS, CMA

## 2021-05-19 ENCOUNTER — Other Ambulatory Visit: Payer: 59

## 2021-05-20 ENCOUNTER — Telehealth: Payer: Self-pay | Admitting: Nurse Practitioner

## 2021-05-20 NOTE — Telephone Encounter (Signed)
Patient called office to inquire about labs that have been pending since 05/08/21.   I reached out to [place] who states this test was sent ou to a specialty lab. Contacted the specialty lab at [phone] and was advised that lab was scheduled to result on Saturday, 9/3.   Contacted patient to advise with no answer. Left a message for patient to call back. AS, CMA

## 2021-05-21 ENCOUNTER — Encounter: Payer: Self-pay | Admitting: Nurse Practitioner

## 2021-05-21 LAB — ARSENIC, TOXIC SPECIES, URINE
Arsenic, DMA: <5 ug/L
Arsenic, Inorganic: <10 ug/L
Arsenic, MMA: <5 ug/L
Arsenic, Total Toxic: <20 ug/L

## 2021-05-21 LAB — HEAVY METALS PROFILE, URINE
Arsenic Ur: 44 ug/L — ABNORMAL HIGH (ref 0–9)
As (Total)/Crt Ratio: 24 ug/g creat
Creatinine(Crt),U: 1.87 g/L (ref 0.30–3.00)
Lead, Rand Ur: NOT DETECTED ug/L (ref 0–49)
Mercury, Ur: NOT DETECTED ug/L (ref 0–19)

## 2021-05-21 NOTE — Progress Notes (Signed)
Hey. I sent the patient a message ove rMyChart that her remaining labs were normal. I saw her follow up is the day before her ultrasound is scheduled. Can we change that for a few days after her ultrasound so that we can review results together? I mentioned that in her mychart message. Thanks.   -HB

## 2021-05-22 ENCOUNTER — Ambulatory Visit (INDEPENDENT_AMBULATORY_CARE_PROVIDER_SITE_OTHER): Payer: 59 | Admitting: Nurse Practitioner

## 2021-05-22 ENCOUNTER — Encounter: Payer: Self-pay | Admitting: Nurse Practitioner

## 2021-05-22 ENCOUNTER — Other Ambulatory Visit: Payer: Self-pay

## 2021-05-22 VITALS — BP 98/63 | HR 103 | Temp 99.0°F | Ht 63.5 in | Wt 130.3 lb

## 2021-05-22 DIAGNOSIS — R059 Cough, unspecified: Secondary | ICD-10-CM | POA: Diagnosis not present

## 2021-05-22 DIAGNOSIS — J069 Acute upper respiratory infection, unspecified: Secondary | ICD-10-CM | POA: Diagnosis not present

## 2021-05-22 DIAGNOSIS — R062 Wheezing: Secondary | ICD-10-CM

## 2021-05-22 DIAGNOSIS — H1013 Acute atopic conjunctivitis, bilateral: Secondary | ICD-10-CM | POA: Diagnosis not present

## 2021-05-22 MED ORDER — ALBUTEROL SULFATE HFA 108 (90 BASE) MCG/ACT IN AERS: 2.0000 | INHALATION_SPRAY | Freq: Four times a day (QID) | RESPIRATORY_TRACT | 2 refills | Status: DC | PRN

## 2021-05-22 MED ORDER — HYDROCOD POLST-CPM POLST ER 10-8 MG/5ML PO SUER: 5.0000 mL | Freq: Two times a day (BID) | ORAL | 0 refills | Status: DC | PRN

## 2021-05-22 MED ORDER — OLOPATADINE HCL 0.2 % OP SOLN: 1.0000 [drp] | Freq: Every day | OPHTHALMIC | 1 refills | Status: DC

## 2021-05-22 MED ORDER — METHYLPREDNISOLONE 4 MG PO TBPK: ORAL_TABLET | ORAL | 0 refills | Status: DC

## 2021-05-22 NOTE — Progress Notes (Signed)
Acute Office Visit  Subjective:    Patient ID: Barbara [name], female    DOB: 1961/04/07, 60 y.o.   MRN: [id number]  Chief Complaint  Patient presents with   Eye Problem    HPI Patient is in today for evaluation of viral respiratory infection.  She states that last week she started having nasal congestion, sore throat, headache, and fever.  Symptoms started on Friday, 1 week ago.  She states that on Saturday symptoms got worse.  She did take a COVID test on Sunday.  Results were negative.  She started taking over-the-counter medications for cough and cold.  She states this has really helped a little bit.  Cough is gotten much worse.  Causing some shortness of breath.  Worse when trying to sleep or trying to talk to people.  She continues to be congested.  Low-grade fever continues.  She took a second home test for COVID-19.  This was on Tuesday.  Results continue to be negative.  She denies nausea, vomiting, or diarrhea.  She is able to taste and smell as normal.  Neck  Past Medical History:  Diagnosis Date   Allergy    Anxiety    Arthritis    Depression    Hyperlipidemia    no treatment    Pre-diabetes    no meds     Past Surgical History:  Procedure Laterality Date   GUM SURGERY     MANDIBLE FRACTURE SURGERY  1975   TONSILLECTOMY     TUBAL LIGATION     WISDOM TOOTH EXTRACTION      Family History  Problem Relation Age of Onset   Heart attack Father    Diabetes Sister    Heart attack Brother    Diabetes Brother    Heart attack Maternal Grandfather    Diabetes Maternal Grandfather    Cancer Paternal Grandmother 40       breast   Breast cancer Paternal Grandmother    Heart attack Paternal Grandfather    Colon cancer Neg Hx    Colon polyps Neg Hx    Esophageal cancer Neg Hx    Stomach cancer Neg Hx    Rectal cancer Neg Hx     Social History   Socioeconomic History   Marital status: Married    Spouse name: Not on file   Number of children: Not on file    Years of education: Not on file   Highest education level: Not on file  Occupational History   Not on file  Tobacco Use   Smoking status: Former    Packs/day: 0.75    Years: 30.00    Pack years: 22.50    Types: Cigarettes    Quit date: 09/21/2011    Years since quitting: 9.6   Smokeless tobacco: Never  Vaping Use   Vaping Use: Never used  Substance and Sexual Activity   Alcohol use: Never   Drug use: Never   Sexual activity: Yes    Birth control/protection: Surgical  Other Topics Concern   Not on file  Social History Narrative   Not on file   Social Determinants of Health   Financial Resource Strain: Not on file  Food Insecurity: Not on file  Transportation Needs: Not on file  Physical Activity: Not on file  Stress: Not on file  Social Connections: Not on file  Intimate Partner Violence: Not on file    Outpatient Medications Prior to Visit  Medication Sig Dispense Refill  desonide (DESOWEN) 0.05 % cream Apply topically 2 (two) times daily. (Patient taking differently: Apply 1 application topically 2 (two) times daily as needed (rash).) 30 g 0   diphenhydrAMINE (BENADRYL) 25 mg capsule Take 25 mg by mouth daily.     No facility-administered medications prior to visit.    No Known Allergies  Review of Systems  Constitutional:  Positive for chills, fatigue and fever. Negative for activity change and appetite change.  HENT:  Positive for congestion, ear pain, postnasal drip, rhinorrhea, sinus pressure, sinus pain and sore throat. Negative for sneezing.   Eyes:  Positive for itching.       Eye irritation and excessive watering of both eyes.  Respiratory:  Positive for cough and shortness of breath. Negative for chest tightness and wheezing.   Cardiovascular:  Negative for chest pain and palpitations.  Gastrointestinal:  Negative for abdominal pain, constipation, diarrhea, nausea and vomiting.  Endocrine: Negative for cold intolerance, heat intolerance, polydipsia and  polyuria.  Genitourinary:  Negative for dyspareunia, dysuria, flank pain, frequency and urgency.  Musculoskeletal:  Negative for arthralgias, back pain and myalgias.  Skin:  Negative for rash.  Allergic/Immunologic: Positive for environmental allergies.  Neurological:  Positive for headaches. Negative for dizziness and weakness.  Hematological:  Negative for adenopathy.  Psychiatric/Behavioral:  The patient is not nervous/anxious.       Objective:    Physical Exam Vitals and nursing note reviewed.  Constitutional:      Appearance: Normal appearance. She is well-developed. She is ill-appearing.  HENT:     Head: Normocephalic and atraumatic.     Nose: Nose normal.  Eyes:     Pupils: Pupils are equal, round, and reactive to light.     Comments: Sclera appear red and irritated bilaterally.  Conjunctiva irritated.  There is lower eyelid edema, bilaterally.  Excessive tearing present.  Cardiovascular:     Rate and Rhythm: Normal rate and regular rhythm.     Pulses: Normal pulses.     Heart sounds: Normal heart sounds.  Pulmonary:     Effort: Pulmonary effort is normal.     Breath sounds: Wheezing present.     Comments: Patient has moderate, dry, harsh cough.  Is nonproductive. Abdominal:     Palpations: Abdomen is soft.  Musculoskeletal:        General: Normal range of motion.     Cervical back: Normal range of motion and neck supple.  Lymphadenopathy:     Cervical: No cervical adenopathy.  Skin:    General: Skin is warm and dry.     Capillary Refill: Capillary refill takes less than 2 seconds.  Neurological:     General: No focal deficit present.     Mental Status: She is alert and oriented to person, place, and time.  Psychiatric:        Mood and Affect: Mood normal.        Behavior: Behavior normal.        Thought Content: Thought content normal.        Judgment: Judgment normal.   Today's Vitals   05/22/21 1121  BP: 98/63  Pulse: (!) 103  Temp: 99 F (37.2 C)   SpO2: 97%  Weight: 130 lb 4.8 oz (59.1 kg)  Height: 5' 3.5" (1.613 m)   Body mass index is 22.72 kg/m.   Wt Readings from Last 3 Encounters:  05/22/21 130 lb 4.8 oz (59.1 kg)  05/25/21 134 lb 8 oz (61 kg)  05/07/21 135 lb 6.4  oz (61.4 kg)    Health Maintenance Due  Topic Date Due   COVID-19 Vaccine (1) Never done   PAP SMEAR-Modifier  Never done   Zoster Vaccines- Shingrix (1 of 2) Never done   INFLUENZA VACCINE  Never done    There are no preventive care reminders to display for this patient.   Lab Results  Component Value Date   TSH 2.990 01/30/2021   Lab Results  Component Value Date   WBC 6.7 05/08/2021   HGB 13.7 05/08/2021   HCT 41.2 05/08/2021   MCV 93 05/08/2021   PLT 277 05/08/2021   Lab Results  Component Value Date   NA 142 05/08/2021   K 4.2 05/08/2021   CO2 25 05/08/2021   GLUCOSE 88 05/08/2021   BUN 14 05/08/2021   CREATININE 1.08 (H) 05/08/2021   BILITOT 0.4 01/30/2021   ALKPHOS 76 01/30/2021   AST 39 01/30/2021   ALT 46 (H) 01/30/2021   PROT 7.3 01/30/2021   ALBUMIN 4.6 01/30/2021   CALCIUM 9.3 05/08/2021   EGFR 58 (L) 05/08/2021   Lab Results  Component Value Date   CHOL 268 (H) 01/30/2021   Lab Results  Component Value Date   HDL 42 01/30/2021   Lab Results  Component Value Date   LDLCALC 192 (H) 01/30/2021   Lab Results  Component Value Date   TRIG 182 (H) 01/30/2021   Lab Results  Component Value Date   CHOLHDL 6.4 (H) 01/30/2021   Lab Results  Component Value Date   HGBA1C 5.9 (H) 01/30/2021       Assessment & Plan:  1. Viral upper respiratory tract infection PCR testing done for COVID-19 virus.  Will notify patient of results.  Start Medrol taper.  Take as directed for 6 days.  Continue over-the-counter medications to treat acute symptoms as needed and as indicated.  Advised patient to seek emergency care if shortness of breath or chest pain develop over the weekend.  She voiced understanding and agreement with  that plan. - Novel Coronavirus, NAA ([facility]) - methylPREDNISolone (MEDROL) 4 MG TBPK tablet; Take by mouth as directed for 6 days (Patient not taking: Reported on 05/25/2021)  Dispense: 21 tablet; Refill: 0  2. Allergic conjunctivitis of both eyes Start Pataday eyedrops.  She may use 1 drop in both eyes daily. - Olopatadine HCl 0.2 % SOLN; Place 1 drop into both eyes daily at 6 (six) AM. (Patient taking differently: Place 1 drop into both eyes daily.)  Dispense: 2.5 mL; Refill: 1  3. Cough Start Medrol taper.  Take as directed for 6 days.  Add albuterol inhaler.  Use 2 inhalations up to 4 times daily as needed for cough and shortness of breath.  Prescribed tussionex cough suppressant to take twice daily as needed for cough. Recommended she use at night and when at home only, as this medication can cause significant drowsiness and dizziness.  - methylPREDNISolone (MEDROL) 4 MG TBPK tablet; Take by mouth as directed for 6 days (Patient not taking: Reported on 05/25/2021)  Dispense: 21 tablet; Refill: 0 - albuterol (VENTOLIN HFA) 108 (90 Base) MCG/ACT inhaler; Inhale 2 puffs into the lungs every 6 (six) hours as needed for wheezing or shortness of breath.  Dispense: 1 each; Refill: 2 - chlorpheniramine-HYDROcodone (TUSSIONEX PENNKINETIC ER) 10-8 MG/5ML SUER; Take 5 mLs by mouth every 12 (twelve) hours as needed for cough.  Dispense: 115 mL; Refill: 0  4. Wheezing Medrol taper started.  Take as directed for 6 days.  Added albuterol rescue inhaler.  May use up to 2 inhalations 4 times daily as needed for shortness of breath, cough, and wheezing. - methylPREDNISolone (MEDROL) 4 MG TBPK tablet; Take by mouth as directed for 6 days (Patient not taking: Reported on 05/25/2021)  Dispense: 21 tablet; Refill: 0 - albuterol (VENTOLIN HFA) 108 (90 Base) MCG/ACT inhaler; Inhale 2 puffs into the lungs every 6 (six) hours as needed for wheezing or shortness of breath.  Dispense: 1 each; Refill: 2   Problem List Items  Addressed This Visit   None Visit Diagnoses     Viral upper respiratory tract infection    -  Primary   Relevant Medications   methylPREDNISolone (MEDROL) 4 MG TBPK tablet   Other Relevant Orders   Novel Coronavirus, NAA ([facility]) (Completed)   Allergic conjunctivitis of both eyes       Relevant Medications   Olopatadine HCl 0.2 % SOLN   Cough       Relevant Medications   methylPREDNISolone (MEDROL) 4 MG TBPK tablet   albuterol (VENTOLIN HFA) 108 (90 Base) MCG/ACT inhaler   chlorpheniramine-HYDROcodone (TUSSIONEX PENNKINETIC ER) 10-8 MG/5ML SUER   Wheezing       Relevant Medications   methylPREDNISolone (MEDROL) 4 MG TBPK tablet   albuterol (VENTOLIN HFA) 108 (90 Base) MCG/ACT inhaler        Meds ordered this encounter  Medications   methylPREDNISolone (MEDROL) 4 MG TBPK tablet    Sig: Take by mouth as directed for 6 days    Dispense:  21 tablet    Refill:  0    Order Specific Question:   Supervising Provider    Answer:   Beatrice Lecher D [[id number]]   albuterol (VENTOLIN HFA) 108 (90 Base) MCG/ACT inhaler    Sig: Inhale 2 puffs into the lungs every 6 (six) hours as needed for wheezing or shortness of breath.    Dispense:  1 each    Refill:  2    Order Specific Question:   Supervising Provider    Answer:   Beatrice Lecher D [[id number]]   chlorpheniramine-HYDROcodone (TUSSIONEX PENNKINETIC ER) 10-8 MG/5ML SUER    Sig: Take 5 mLs by mouth every 12 (twelve) hours as needed for cough.    Dispense:  115 mL    Refill:  0    Order Specific Question:   Supervising Provider    Answer:   Beatrice Lecher D [[id number]]   Olopatadine HCl 0.2 % SOLN    Sig: Place 1 drop into both eyes daily at 6 (six) AM.    Dispense:  2.5 mL    Refill:  1    Order Specific Question:   Supervising Provider    Answer:   Beatrice Lecher D [[id number]]   This note was dictated using Dragon Voice Recognition Software. Rapid proofreading was performed to expedite the delivery of the information. Despite  proofreading, phonetic errors will occur which are common with this voice recognition software. Please take this into consideration. If there are any concerns, please contact our office.     Ronnell Freshwater, NP

## 2021-05-23 LAB — NOVEL CORONAVIRUS, NAA: SARS-CoV-2, NAA: NOT DETECTED

## 2021-05-23 LAB — SARS-COV-2, NAA 2 DAY TAT

## 2021-05-24 ENCOUNTER — Encounter: Payer: Self-pay | Admitting: Nurse Practitioner

## 2021-05-24 NOTE — Progress Notes (Signed)
Negative covid. MyChart message sent to patient.

## 2021-05-25 ENCOUNTER — Other Ambulatory Visit: Payer: Self-pay

## 2021-05-25 ENCOUNTER — Emergency Department (HOSPITAL_COMMUNITY)
Admission: EM | Admit: 2021-05-25 | Discharge: 2021-05-25 | Disposition: A | Discharge status: Home / Self Care | Payer: 59 | Attending: Emergency Medicine | Admitting: Emergency Medicine

## 2021-05-25 ENCOUNTER — Emergency Department (HOSPITAL_COMMUNITY): Payer: 59

## 2021-05-25 ENCOUNTER — Encounter (HOSPITAL_COMMUNITY): Payer: Self-pay

## 2021-05-25 DIAGNOSIS — H1033 Unspecified acute conjunctivitis, bilateral: Secondary | ICD-10-CM | POA: Insufficient documentation

## 2021-05-25 DIAGNOSIS — Z20822 Contact with and (suspected) exposure to covid-19: Secondary | ICD-10-CM | POA: Insufficient documentation

## 2021-05-25 DIAGNOSIS — R062 Wheezing: Secondary | ICD-10-CM | POA: Insufficient documentation

## 2021-05-25 DIAGNOSIS — J069 Acute upper respiratory infection, unspecified: Secondary | ICD-10-CM | POA: Insufficient documentation

## 2021-05-25 DIAGNOSIS — Z87891 Personal history of nicotine dependence: Secondary | ICD-10-CM | POA: Diagnosis not present

## 2021-05-25 DIAGNOSIS — R059 Cough, unspecified: Secondary | ICD-10-CM

## 2021-05-25 DIAGNOSIS — H1013 Acute atopic conjunctivitis, bilateral: Secondary | ICD-10-CM | POA: Insufficient documentation

## 2021-05-25 LAB — RESP PANEL BY RT-PCR (FLU A&B, COVID) ARPGX2
Influenza A by PCR: NEGATIVE
Influenza B by PCR: NEGATIVE
SARS Coronavirus 2 by RT PCR: NEGATIVE

## 2021-05-25 MED ORDER — BENZONATATE 100 MG PO CAPS: 100.0000 mg | ORAL_CAPSULE | Freq: Two times a day (BID) | ORAL | 0 refills | Status: DC | PRN

## 2021-05-25 MED ORDER — AZITHROMYCIN 250 MG PO TABS: 250.0000 mg | ORAL_TABLET | Freq: Every day | ORAL | 0 refills | Status: DC

## 2021-05-25 NOTE — Discharge Instructions (Addendum)
Take antibiotics as directed.  Discontinue eye drops and use preservative free artificial tears.    If your symptoms change or worsen, follow-up with your doctor sooner.  Your x-ray, flu A, flu B, and covid tests were all negative.

## 2021-05-25 NOTE — ED Triage Notes (Signed)
Pt c/o headache, cough, nasal congestion, eye discharge, ear pain,and fatigue for a week and a half. Pt seen by PCP for same.

## 2021-05-25 NOTE — ED Provider Notes (Signed)
[facility] EMERGENCY DEPARTMENT Provider Note   CSN: [id number] Arrival date & time: 05/25/21  [id number]     History Chief Complaint  Patient presents with   Cough    Barbara [name] is a 60 y.o. female.  Patient presents to the emergency department with a chief complaint of cough, nasal congestion, headache.  She reports having symptoms for the past 10 days.  She was seen by her PCP and had negative COVID test.  She reports that she has had low-grade fevers to 100.4 degrees.  She reports that this morning she awoke with some right-sided ear pain.  She reports associated bilateral conjunctival erythema and discharge, but states that this has been improving.  She states that she has been coughing up "green stuff."  The history is provided by the patient. No language interpreter was used.      Past Medical History:  Diagnosis Date   Allergy    Anxiety    Arthritis    Depression    Hyperlipidemia    no treatment    Pre-diabetes    no meds     Patient Active Problem List   Diagnosis Date Noted   Right upper quadrant abdominal pain 05/11/2021   Intractable vomiting 05/11/2021   Screening, poisoning, heavy metal 05/11/2021   Prediabetes 12/18/2019   Hyperlipidemia 12/18/2019   Hypertriglyceridemia 12/18/2019   Low HDL (under 40) 12/18/2019   Elevated alanine aminotransferase (ALT) level 12/18/2019   GAD (generalized anxiety disorder) 11/08/2018   Depression, major, single episode, moderate ([place]) 10/11/2018   Eczema 10/11/2018   Insomnia 10/11/2018   Abnormal CBC 06/12/2018   Low blood pressure reading 06/12/2018   Healthcare maintenance 05/18/2018   Screening for breast cancer 05/18/2018   Axillary pain, right 05/18/2018    Past Surgical History:  Procedure Laterality Date   GUM SURGERY     MANDIBLE FRACTURE SURGERY  1975   TONSILLECTOMY     TUBAL LIGATION     WISDOM TOOTH EXTRACTION       OB History   No obstetric history on file.      Family History  Problem Relation Age of Onset   Heart attack Father    Diabetes Sister    Heart attack Brother    Diabetes Brother    Heart attack Maternal Grandfather    Diabetes Maternal Grandfather    Cancer Paternal Grandmother 40       breast   Breast cancer Paternal Grandmother    Heart attack Paternal Grandfather    Colon cancer Neg Hx    Colon polyps Neg Hx    Esophageal cancer Neg Hx    Stomach cancer Neg Hx    Rectal cancer Neg Hx     Social History   Tobacco Use   Smoking status: Former    Packs/day: 0.75    Years: 30.00    Pack years: 22.50    Types: Cigarettes    Quit date: 09/21/2011    Years since quitting: 9.6   Smokeless tobacco: Never  Vaping Use   Vaping Use: Never used  Substance Use Topics   Alcohol use: Never   Drug use: Never    Home Medications Prior to Admission medications   Medication Sig Start Date End Date Taking? Authorizing Provider  albuterol (VENTOLIN HFA) 108 (90 Base) MCG/ACT inhaler Inhale 2 puffs into the lungs every 6 (six) hours as needed for wheezing or shortness of breath. 05/22/21  Yes Ronnell Freshwater, NP  chlorpheniramine-HYDROcodone (TUSSIONEX PENNKINETIC ER) 10-8 MG/5ML SUER Take 5 mLs by mouth every 12 (twelve) hours as needed for cough. 05/22/21  Yes Boscia, Greer Ee, NP  Cholecalciferol (VITAMIN D3) 50 MCG (2000 UT) TABS Take 2,000 Units by mouth daily.   Yes [provider]  desonide (DESOWEN) 0.05 % cream Apply topically 2 (two) times daily. Patient taking differently: Apply 1 application topically 2 (two) times daily as needed (rash). 01/30/21  Yes Abonza, Maritza, PA-C  diphenhydrAMINE (BENADRYL) 25 mg capsule Take 25 mg by mouth daily.   Yes [provider]  ibuprofen (ADVIL) 200 MG tablet Take 200 mg by mouth every 6 (six) hours as needed for headache or moderate pain.   Yes [provider]  Multiple Vitamin (MULTI VITAMIN) TABS Take 1 tablet by mouth daily.   Yes [provider]   Olopatadine HCl 0.2 % SOLN Place 1 drop into both eyes daily at 6 (six) AM. Patient taking differently: Place 1 drop into both eyes daily. 05/22/21  Yes Boscia, Greer Ee, NP  vitamin B-12 (CYANOCOBALAMIN) 500 MCG tablet Take 500 mcg by mouth daily.   Yes [provider]  methylPREDNISolone (MEDROL) 4 MG TBPK tablet Take by mouth as directed for 6 days Patient not taking: Reported on 05/25/2021 05/22/21   Ronnell Freshwater, NP    Allergies    Patient has no known allergies.  Review of Systems   Review of Systems  All other systems reviewed and are negative.  Physical Exam Updated Vital Signs BP 133/67   Pulse 88   Temp 98.6 F (37 C) (Oral)   Resp 15   LMP  (LMP Unknown)   SpO2 99%   Physical Exam Vitals and nursing note reviewed.  Constitutional:      General: She is not in acute distress.    Appearance: She is well-developed.  HENT:     Head: Normocephalic and atraumatic.     Right Ear: Tympanic membrane normal.     Left Ear: Tympanic membrane normal.  Eyes:     General:        Right eye: Discharge present.        Left eye: Discharge present.    Comments: Bilateral conjunctival erythema  Cardiovascular:     Rate and Rhythm: Normal rate and regular rhythm.     Heart sounds: No murmur heard. Pulmonary:     Effort: Pulmonary effort is normal. No respiratory distress.     Breath sounds: Normal breath sounds.  Abdominal:     Palpations: Abdomen is soft.     Tenderness: There is no abdominal tenderness.  Musculoskeletal:        General: Normal range of motion.     Cervical back: Neck supple.  Skin:    General: Skin is warm and dry.  Neurological:     Mental Status: She is alert and oriented to person, place, and time.  Psychiatric:        Mood and Affect: Mood normal.        Behavior: Behavior normal.    ED Results / Procedures / Treatments   Labs (all labs ordered are listed, but only abnormal results are displayed) Labs Reviewed  RESP PANEL BY RT-PCR  (FLU A&B, COVID) [id number]    EKG None  Radiology No results found.  Procedures Procedures   Medications Ordered in ED Medications - No data to display  ED Course  I have reviewed the triage vital signs and the nursing notes.  Pertinent  labs & imaging results that were available during my care of the patient were reviewed by me and considered in my medical decision making (see chart for details).    MDM Rules/Calculators/A&P                           Barbara [name] was evaluated in Emergency Department on 05/25/2021 for the symptoms described in the history of present illness. She was evaluated in the context of the global COVID-19 pandemic, which necessitated consideration that the patient might be at risk for infection with the SARS-CoV-2 virus that causes COVID-19. Institutional protocols and algorithms that pertain to the evaluation of patients at risk for COVID-19 are in a state of rapid change based on information released by regulatory bodies including the CDC and federal and state organizations. These policies and algorithms were followed during the patient's care in the ED.  CXR negative.  COVID and flu negative.   Patient is nontoxic in appearance.  Has mild conjunctivitis, no responding to treatment.  Consider viral vs conjunctivitis medicamentosa.  Normal EOMs.  Advised to discontinue drops and only use preservative free artificial tears for the next few days to see if that helps.  Z-pak for cough given length of symptoms.  TMs don't look infected.  Doubt sinusitis.    Final Clinical Impression(s) / ED Diagnoses Final diagnoses:  Cough  Upper respiratory tract infection, unspecified type    Rx / DC Orders ED Discharge Orders     None        Montine Circle, PA-C 05/25/21 [id number]    Luna Fuse, MD 05/27/21 [phone]

## 2021-05-27 ENCOUNTER — Ambulatory Visit: Payer: 59 | Admitting: Nurse Practitioner

## 2021-05-28 ENCOUNTER — Other Ambulatory Visit: Payer: 59

## 2021-05-29 ENCOUNTER — Ambulatory Visit: Payer: 59 | Admitting: Nurse Practitioner

## 2021-06-03 ENCOUNTER — Ambulatory Visit: Payer: 59 | Admitting: Nurse Practitioner

## 2021-06-24 ENCOUNTER — Other Ambulatory Visit: Payer: Self-pay

## 2021-06-25 ENCOUNTER — Encounter: Payer: Self-pay | Admitting: Internal Medicine

## 2021-06-25 ENCOUNTER — Ambulatory Visit: Payer: 59 | Admitting: Internal Medicine

## 2021-06-25 VITALS — BP 110/68 | HR 70 | Temp 97.9°F | Ht 64.0 in | Wt 128.8 lb

## 2021-06-25 DIAGNOSIS — R7303 Prediabetes: Secondary | ICD-10-CM | POA: Diagnosis not present

## 2021-06-25 DIAGNOSIS — E785 Hyperlipidemia, unspecified: Secondary | ICD-10-CM

## 2021-06-25 DIAGNOSIS — Z124 Encounter for screening for malignant neoplasm of cervix: Secondary | ICD-10-CM

## 2021-06-25 DIAGNOSIS — E781 Pure hyperglyceridemia: Secondary | ICD-10-CM

## 2021-06-25 NOTE — Progress Notes (Signed)
New Patient Office Visit     This visit occurred during the SARS-CoV-2 public health emergency.  Safety protocols were in place, including screening questions prior to the visit, additional usage of staff PPE, and extensive cleaning of exam room while observing appropriate contact time as indicated for disinfecting solutions.    CC/Reason for Visit: Establish care, discuss chronic medical conditions Previous PCP: Leretha Pol, NP Last Visit: September 2022  HPI: Barbara [name] is a 60 y.o. female who is coming in today for the above mentioned reasons. Past Medical History is significant for: Hyperlipidemia not on medication, impaired glucose tolerance.  She is an Optometrist for [employer].  She is a former smoker of about three quarters of a pack a day but quit over 10 years ago, no alcohol use, no known drug allergies, family history significant for mother with dementia and a paternal grandmother who had breast cancer in her 42s.  She is overdue for flu, COVID, shingles vaccines.  She had a colonoscopy in 2021, she had a mammogram in August 2022, she is overdue for cervical cancer screening.  She has no acute concerns or complaints today.   Past Medical/Surgical History: Past Medical History:  Diagnosis Date   Allergy    Anxiety    Arthritis    Depression    Hyperlipidemia    no treatment    Pre-diabetes    no meds     Past Surgical History:  Procedure Laterality Date   GUM SURGERY     MANDIBLE FRACTURE SURGERY  1975   TONSILLECTOMY     TUBAL LIGATION     WISDOM TOOTH EXTRACTION      Social History:  reports that she quit smoking about 9 years ago. Her smoking use included cigarettes. She has a 22.50 pack-year smoking history. She has never used smokeless tobacco. She reports that she does not drink alcohol and does not use drugs.  Allergies: No Known Allergies  Family History:  Family History  Problem Relation Age of Onset   Dementia Mother     Heart attack Father    Diabetes Sister    Heart attack Brother    Diabetes Brother    Heart attack Maternal Grandfather    Diabetes Maternal Grandfather    Cancer Paternal Grandmother 10       breast   Breast cancer Paternal Grandmother    Heart attack Paternal Grandfather    Colon cancer Neg Hx    Colon polyps Neg Hx    Esophageal cancer Neg Hx    Stomach cancer Neg Hx    Rectal cancer Neg Hx      Current Outpatient Medications:    albuterol (VENTOLIN HFA) 108 (90 Base) MCG/ACT inhaler, Inhale 2 puffs into the lungs every 6 (six) hours as needed for wheezing or shortness of breath., Disp: 1 each, Rfl: 2   Cholecalciferol (VITAMIN D3) 50 MCG (2000 UT) TABS, Take 2,000 Units by mouth daily., Disp: , Rfl:    desonide (DESOWEN) 0.05 % cream, Apply topically 2 (two) times daily. (Patient taking differently: Apply 1 application topically 2 (two) times daily as needed (rash).), Disp: 30 g, Rfl: 0   diphenhydrAMINE (BENADRYL) 25 mg capsule, Take 25 mg by mouth daily., Disp: , Rfl:    Multiple Vitamin (MULTI VITAMIN) TABS, Take 1 tablet by mouth daily., Disp: , Rfl:    vitamin B-12 (CYANOCOBALAMIN) 500 MCG tablet, Take 500 mcg by mouth daily., Disp: , Rfl:   Review  of Systems:  Constitutional: Denies fever, chills, diaphoresis, appetite change and fatigue.  HEENT: Denies photophobia, eye pain, redness, hearing loss, ear pain, congestion, sore throat, rhinorrhea, sneezing, mouth sores, trouble swallowing, neck pain, neck stiffness and tinnitus.   Respiratory: Denies SOB, DOE, cough, chest tightness,  and wheezing.   Cardiovascular: Denies chest pain, palpitations and leg swelling.  Gastrointestinal: Denies nausea, vomiting, abdominal pain, diarrhea, constipation, blood in stool and abdominal distention.  Genitourinary: Denies dysuria, urgency, frequency, hematuria, flank pain and difficulty urinating.  Endocrine: Denies: hot or cold intolerance, sweats, changes in hair or nails, polyuria,  polydipsia. Musculoskeletal: Denies myalgias, back pain, joint swelling, arthralgias and gait problem.  Skin: Denies pallor, rash and wound.  Neurological: Denies dizziness, seizures, syncope, weakness, light-headedness, numbness and headaches.  Hematological: Denies adenopathy. Easy bruising, personal or family bleeding history  Psychiatric/Behavioral: Denies suicidal ideation, mood changes, confusion, nervousness, sleep disturbance and agitation    Physical Exam: Vitals:   06/25/21 1301  BP: 110/68  Pulse: 70  Temp: 97.9 F (36.6 C)  TempSrc: Oral  SpO2: 99%  Weight: 128 lb 12.8 oz (58.4 kg)  Height: 5\' 4"  (1.626 m)   Body mass index is 22.11 kg/m.  Constitutional: NAD, calm, comfortable Eyes: PERRL, lids and conjunctivae normal ENMT: Mucous membranes are moist.  Respiratory: clear to auscultation bilaterally, no wheezing, no crackles. Normal respiratory effort. No accessory muscle use.  Cardiovascular: Regular rate and rhythm, no murmurs / rubs / gallops. No extremity edema.  Neurologic: Grossly intact and nonfocal Psychiatric: Normal judgment and insight. Alert and oriented x 3. Normal mood.    Impression and Plan:  Screening for cervical cancer - Plan: Ambulatory referral to Obstetrics / Gynecology  Hyperlipidemia, unspecified hyperlipidemia type  Hypertriglyceridemia -Last lipid panel in May 2022 with a total cholesterol of 268, LDL 192, triglycerides 182. -Check lipid panel today. -If LDL is again confirmed at this level I have discussed importance of starting a statin especially given her BMI of 22 does not allow for much change with lifestyle changes.  Prediabetes -A1c was 5.9 in May 2022.  Time spent: 45 minutes reviewing chart, interviewing and examining patient and formulating plan of care.    Patient Instructions  -Nice seeing you today!!  -Return fasting for labs.  -Schedule follow up in 6 months for your physical.  -Remember flu, COVID, tetanus  and shingles vaccines.    Lelon Frohlich, MD [facility]

## 2021-06-25 NOTE — Patient Instructions (Signed)
-  Nice seeing you today!!  -Return fasting for labs.  -Schedule follow up in 6 months for your physical.  -Remember flu, COVID, tetanus and shingles vaccines.

## 2021-07-01 ENCOUNTER — Other Ambulatory Visit (INDEPENDENT_AMBULATORY_CARE_PROVIDER_SITE_OTHER): Payer: 59

## 2021-07-01 ENCOUNTER — Other Ambulatory Visit: Payer: Self-pay

## 2021-07-01 DIAGNOSIS — E785 Hyperlipidemia, unspecified: Secondary | ICD-10-CM | POA: Diagnosis not present

## 2021-07-01 LAB — LDL CHOLESTEROL, DIRECT: Direct LDL: 133 mg/dL

## 2021-07-01 LAB — LIPID PANEL
Cholesterol: 211 mg/dL — ABNORMAL HIGH (ref 0–200)
HDL: 32.9 mg/dL — ABNORMAL LOW (ref >39.00)
NonHDL: 177.72
Total CHOL/HDL Ratio: 6
Triglycerides: 257 mg/dL — ABNORMAL HIGH (ref 0.0–149.0)
VLDL: 51.4 mg/dL — ABNORMAL HIGH (ref 0.0–40.0)

## 2021-07-07 ENCOUNTER — Telehealth: Payer: Self-pay | Admitting: Internal Medicine

## 2021-07-07 NOTE — Telephone Encounter (Signed)
Pt is calling and has viewed her blood work results on [employer] and her chole is high and she would like to know the next step

## 2021-07-09 ENCOUNTER — Other Ambulatory Visit: Payer: Self-pay | Admitting: Internal Medicine

## 2021-07-09 DIAGNOSIS — E782 Mixed hyperlipidemia: Secondary | ICD-10-CM

## 2021-07-09 MED ORDER — ATORVASTATIN CALCIUM 20 MG PO TABS: 20.0000 mg | ORAL_TABLET | Freq: Every day | ORAL | 1 refills | Status: DC

## 2021-07-09 NOTE — Telephone Encounter (Signed)
Patient called to follow up on her previous phone call about the next steps she should take about her high cholesterol levels. I offered a virtual visit but patient wanted message sent back first and then will schedule if she does not get a call back.      Good callback number is [phone]    Please Advise

## 2021-07-10 ENCOUNTER — Other Ambulatory Visit: Payer: Self-pay | Admitting: Internal Medicine

## 2021-07-10 DIAGNOSIS — E782 Mixed hyperlipidemia: Secondary | ICD-10-CM

## 2021-07-10 NOTE — Telephone Encounter (Signed)
Spoke with patient and reviewed lab results. See lab result note. 

## 2021-09-03 ENCOUNTER — Ambulatory Visit: Payer: 59 | Admitting: Internal Medicine

## 2021-09-03 VITALS — BP 110/70 | HR 88 | Temp 97.9°F | Wt 132.2 lb

## 2021-09-03 DIAGNOSIS — M5431 Sciatica, right side: Secondary | ICD-10-CM

## 2021-09-03 DIAGNOSIS — Z77010 Contact with and (suspected) exposure to arsenic: Secondary | ICD-10-CM | POA: Diagnosis not present

## 2021-09-03 NOTE — Progress Notes (Signed)
Established Patient Office Visit     This visit occurred during the SARS-CoV-2 public health emergency.  Safety protocols were in place, including screening questions prior to the visit, additional usage of staff PPE, and extensive cleaning of exam room while observing appropriate contact time as indicated for disinfecting solutions.    CC/Reason for Visit: Right leg numbness  HPI: Barbara [name] is a 60 y.o. female who is coming in today for the above mentioned reasons. Past Medical History is significant for: Hyperlipidemia and impaired glucose tolerance.  She is here today for evaluation of right leg numbness.  Numbness courses around the posterior part of her thigh.  She tells me that years ago she was told she had some disc issues.  She would like me to review her heavy metal screen.  Sure enough she had a heavy metal screen back in August that showed high arsenic levels.  It was thought it might have been contaminated with seafood.  When I asked patient why the heavy metal screen was done she says: " My husband does all the cooking in the house and sometimes I have noticed that when he is upset at me I have increased abdominal pain, nausea, bloating, diarrhea.  After Internet research I decided to get tested.  He also told me that he used to mix MiraLAX in his ex-wife's food to cause her diarrhea".  She has not had any further GI issues.  She has not noticed any hyperpigmentation or rashes.  She has not noticed lines on her fingernails.   Past Medical/Surgical History: Past Medical History:  Diagnosis Date   Allergy    Anxiety    Arthritis    Depression    Hyperlipidemia    no treatment    Pre-diabetes    no meds     Past Surgical History:  Procedure Laterality Date   GUM SURGERY     MANDIBLE FRACTURE SURGERY  1975   TONSILLECTOMY     TUBAL LIGATION     WISDOM TOOTH EXTRACTION      Social History:  reports that she quit smoking about 9 years ago. Her smoking use  included cigarettes. She has a 22.50 pack-year smoking history. She has never used smokeless tobacco. She reports that she does not drink alcohol and does not use drugs.  Allergies: No Known Allergies  Family History:  Family History  Problem Relation Age of Onset   Dementia Mother    Heart attack Father    Diabetes Sister    Heart attack Brother    Diabetes Brother    Heart attack Maternal Grandfather    Diabetes Maternal Grandfather    Cancer Paternal Grandmother 45       breast   Breast cancer Paternal Grandmother    Heart attack Paternal Grandfather    Colon cancer Neg Hx    Colon polyps Neg Hx    Esophageal cancer Neg Hx    Stomach cancer Neg Hx    Rectal cancer Neg Hx      Current Outpatient Medications:    albuterol (VENTOLIN HFA) 108 (90 Base) MCG/ACT inhaler, Inhale 2 puffs into the lungs every 6 (six) hours as needed for wheezing or shortness of breath., Disp: 1 each, Rfl: 2   atorvastatin (LIPITOR) 20 MG tablet, Take 1 tablet (20 mg total) by mouth daily., Disp: 90 tablet, Rfl: 1   Cholecalciferol (VITAMIN D3) 50 MCG (2000 UT) TABS, Take 2,000 Units by mouth daily., Disp: ,  Rfl:    desonide (DESOWEN) 0.05 % cream, Apply topically 2 (two) times daily. (Patient taking differently: Apply 1 application topically 2 (two) times daily as needed (rash).), Disp: 30 g, Rfl: 0   diphenhydrAMINE (BENADRYL) 25 mg capsule, Take 25 mg by mouth daily., Disp: , Rfl:    Multiple Vitamin (MULTI VITAMIN) TABS, Take 1 tablet by mouth daily., Disp: , Rfl:    vitamin B-12 (CYANOCOBALAMIN) 500 MCG tablet, Take 500 mcg by mouth daily., Disp: , Rfl:   Review of Systems:  Constitutional: Denies fever, chills, diaphoresis, appetite change and fatigue.  HEENT: Denies photophobia, eye pain, redness, hearing loss, ear pain, congestion, sore throat, rhinorrhea, sneezing, mouth sores, trouble swallowing, neck pain, neck stiffness and tinnitus.   Respiratory: Denies SOB, DOE, cough, chest tightness,   and wheezing.   Cardiovascular: Denies chest pain, palpitations and leg swelling.  Gastrointestinal: Denies nausea, vomiting, abdominal pain, diarrhea, constipation, blood in stool and abdominal distention.  Genitourinary: Denies dysuria, urgency, frequency, hematuria, flank pain and difficulty urinating.  Endocrine: Denies: hot or cold intolerance, sweats, changes in hair or nails, polyuria, polydipsia. Musculoskeletal: Denies myalgias, back pain, joint swelling, arthralgias and gait problem.  Skin: Denies pallor, rash and wound.  Neurological: Denies dizziness, seizures, syncope, weakness, light-headedness and headaches.  Hematological: Denies adenopathy. Easy bruising, personal or family bleeding history  Psychiatric/Behavioral: Denies suicidal ideation, mood changes, confusion, nervousness, sleep disturbance and agitation    Physical Exam: Vitals:   09/03/21 1558  BP: 110/70  Pulse: 88  Temp: 97.9 F (36.6 C)  TempSrc: Oral  SpO2: 98%  Weight: 132 lb 3.2 oz (60 kg)    Body mass index is 22.69 kg/m.   Constitutional: NAD, calm, comfortable Eyes: PERRL, lids and conjunctivae normal ENMT: Mucous membranes are moist.  Respiratory: clear to auscultation bilaterally, no wheezing, no crackles. Normal respiratory effort. No accessory muscle use.  Cardiovascular: Regular rate and rhythm, no murmurs / rubs / gallops. No extremity edema.   Skin: no rashes, lesions, ulcers. No induration Psychiatric: Normal judgment and insight. Alert and oriented x 3. Normal mood.    Impression and Plan:  Right sided sciatica  -We will start treating her sciatica with back stretches, local massage therapy, icing as needed NSAIDs, can proceed to physical therapy if needed. -With her concerns about heavy metals and her previously high arsenic screen, I have done some literature research.  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  I think it is important to retest  arsenic.  She has not had any seafood in the past week.  Further plan to follow results. - Plan: Heavy Metals Profile, Urine  Time spent: 36 minutes reviewing chart, performing literature research, interviewing and examining patient and formulating plan of care.    Lelon Frohlich, MD [facility]

## 2021-09-08 ENCOUNTER — Other Ambulatory Visit: Payer: Self-pay | Admitting: Internal Medicine

## 2021-09-08 DIAGNOSIS — M79621 Pain in right upper arm: Secondary | ICD-10-CM

## 2021-09-08 DIAGNOSIS — Z77010 Contact with and (suspected) exposure to arsenic: Secondary | ICD-10-CM

## 2021-09-08 NOTE — Addendum Note (Signed)
Addended by: Octavio Manns E on: 09/08/2021 01:20 PM   Modules accepted: Orders

## 2021-09-15 ENCOUNTER — Other Ambulatory Visit: Payer: 59

## 2021-09-16 ENCOUNTER — Telehealth: Payer: Self-pay | Admitting: Internal Medicine

## 2021-09-16 NOTE — Telephone Encounter (Signed)
Barbara [name] with poison control is calling concerning the arsenic exposure results

## 2021-09-17 NOTE — Telephone Encounter (Signed)
Message sent to lab to check on status.

## 2021-09-17 NOTE — Telephone Encounter (Signed)
I spoke with [facility], results will be available by 09/25/21.

## 2021-09-18 ENCOUNTER — Encounter: Payer: Self-pay | Admitting: Internal Medicine

## 2021-09-22 ENCOUNTER — Telehealth: Payer: Self-pay | Admitting: Internal Medicine

## 2021-09-22 NOTE — Telephone Encounter (Signed)
See phone encounter from 09/16/21 as this encounter does not have a call back number.

## 2021-09-22 NOTE — Telephone Encounter (Signed)
Barbara [name] with [facility] asking about the results

## 2021-09-22 NOTE — Telephone Encounter (Signed)
Representative from Poison control stated that Barbara [name] is on a project & [name] is not available. Message left that (per [facility]) urine results will be available 09/25/20. Rep states she will note this in patient's file.

## 2021-09-24 ENCOUNTER — Telehealth: Payer: Self-pay | Admitting: Internal Medicine

## 2021-09-24 LAB — ARSENIC, TOXIC SPECIES, URINE
Arsenic, DMA: <5 ug/L
Arsenic, Inorganic: <10 ug/L
Arsenic, MMA: <5 ug/L
Arsenic, Total Toxic: <20 ug/L

## 2021-09-24 LAB — HEAVY METALS PROFILE, URINE
Arsenic Ur: 14 ug/L — ABNORMAL HIGH (ref 0–9)
As (Total)/Crt Ratio: 8 ug/g creat
Creatinine(Crt),U: 1.83 g/L (ref 0.30–3.00)
Lead, Rand Ur: NOT DETECTED ug/L (ref 0–49)
Mercury, Ur: NOT DETECTED ug/L (ref 0–19)

## 2021-09-24 LAB — SPECIMEN STATUS REPORT

## 2021-09-24 NOTE — Telephone Encounter (Signed)
I spoke with pt - results are back in. Can you review?

## 2021-09-24 NOTE — Telephone Encounter (Signed)
Pt received her results on mychart and would like someone to explain to her the results

## 2021-09-28 ENCOUNTER — Encounter: Payer: Self-pay | Admitting: Internal Medicine

## 2021-09-28 ENCOUNTER — Telehealth: Payer: Self-pay | Admitting: Internal Medicine

## 2021-09-28 NOTE — Telephone Encounter (Signed)
Patient called to discuss lab results regarding heavy metal testing, states her arsenic level seems to be high enough to cause liver damage. Patient would like a callback to discuss, denied appointment    Good callback number is [phone]  Please advise

## 2021-09-29 ENCOUNTER — Telehealth: Payer: Self-pay | Admitting: Internal Medicine

## 2021-09-29 NOTE — Telephone Encounter (Signed)
Patient has called in requesting explanation for lab results.  Patient states she has left messages with no response and would like for someone to call her back.

## 2021-09-29 NOTE — Telephone Encounter (Signed)
Pt notified of PCP result note. Verb understanding.

## 2021-09-30 NOTE — Telephone Encounter (Signed)
See result note.  

## 2021-10-07 LAB — HEAVY METALS PROFILE, URINE
Arsenic Ur: 10 ug/L — ABNORMAL HIGH (ref 0–9)
As (Total)/Crt Ratio: 7 ug/g creat
Creatinine(Crt),U: 1.53 g/L (ref 0.30–3.00)
Lead, Rand Ur: NOT DETECTED ug/L (ref 0–49)
Mercury, Ur: NOT DETECTED ug/L (ref 0–19)

## 2021-10-07 LAB — ARSENIC, TOXIC SPECIES, URINE
Arsenic, DMA: <5 ug/L
Arsenic, Inorganic: <10 ug/L
Arsenic, MMA: <5 ug/L
Arsenic, Total Toxic: <20 ug/L

## 2021-10-07 LAB — SPECIMEN STATUS REPORT

## 2021-10-12 ENCOUNTER — Other Ambulatory Visit (INDEPENDENT_AMBULATORY_CARE_PROVIDER_SITE_OTHER): Payer: 59

## 2021-10-12 DIAGNOSIS — E782 Mixed hyperlipidemia: Secondary | ICD-10-CM | POA: Diagnosis not present

## 2021-10-12 LAB — LIPID PANEL
Cholesterol: 138 mg/dL (ref 0–200)
HDL: 40 mg/dL (ref >39.00)
LDL Cholesterol: 76 mg/dL (ref 0–99)
NonHDL: 97.72
Total CHOL/HDL Ratio: 3
Triglycerides: 107 mg/dL (ref 0.0–149.0)
VLDL: 21.4 mg/dL (ref 0.0–40.0)

## 2021-10-13 ENCOUNTER — Encounter: Payer: Self-pay | Admitting: Internal Medicine

## 2021-10-13 DIAGNOSIS — E782 Mixed hyperlipidemia: Secondary | ICD-10-CM

## 2021-10-14 MED ORDER — ATORVASTATIN CALCIUM 20 MG PO TABS: 20.0000 mg | ORAL_TABLET | Freq: Every day | ORAL | 1 refills | Status: DC

## 2021-12-19 ENCOUNTER — Other Ambulatory Visit: Payer: Self-pay | Admitting: Internal Medicine

## 2021-12-19 DIAGNOSIS — E782 Mixed hyperlipidemia: Secondary | ICD-10-CM

## 2022-02-23 ENCOUNTER — Encounter: Payer: Self-pay | Admitting: Internal Medicine

## 2022-02-23 ENCOUNTER — Ambulatory Visit: Payer: 59 | Admitting: Internal Medicine

## 2022-02-23 VITALS — BP 98/64 | HR 64 | Temp 98.4°F | Wt 115.0 lb

## 2022-02-23 DIAGNOSIS — R590 Localized enlarged lymph nodes: Secondary | ICD-10-CM | POA: Diagnosis not present

## 2022-02-23 NOTE — Progress Notes (Signed)
Established Patient Office Visit     CC/Reason for Visit: "Swollen node in neck"  HPI: Barbara [name] is a 61 y.o. female who is coming in today for the above mentioned reasons.  She has what she believes is a swollen lymph node on the right side of her neck under her mandible.  She has been dealing with some teeth issues.  She is also describing some mild right ear pain and she does admit to recently having had an upper respiratory infection.  Past Medical/Surgical History: Past Medical History:  Diagnosis Date   Allergy    Anxiety    Arthritis    Depression    Hyperlipidemia    no treatment    Pre-diabetes    no meds     Past Surgical History:  Procedure Laterality Date   GUM SURGERY     MANDIBLE FRACTURE SURGERY  1975   TONSILLECTOMY     TUBAL LIGATION     WISDOM TOOTH EXTRACTION      Social History:  reports that she quit smoking about 10 years ago. Her smoking use included cigarettes. She has a 22.50 pack-year smoking history. She has never used smokeless tobacco. She reports that she does not drink alcohol and does not use drugs.  Allergies: No Known Allergies  Family History:  Family History  Problem Relation Age of Onset   Dementia Mother    Heart attack Father    Diabetes Sister    Heart attack Brother    Diabetes Brother    Heart attack Maternal Grandfather    Diabetes Maternal Grandfather    Cancer Paternal Grandmother 75       breast   Breast cancer Paternal Grandmother    Heart attack Paternal Grandfather    Colon cancer Neg Hx    Colon polyps Neg Hx    Esophageal cancer Neg Hx    Stomach cancer Neg Hx    Rectal cancer Neg Hx      Current Outpatient Medications:    atorvastatin (LIPITOR) 20 MG tablet, TAKE 1 TABLET(20 MG) BY MOUTH DAILY, Disp: 90 tablet, Rfl: 1   Cholecalciferol (VITAMIN D3) 50 MCG (2000 UT) TABS, Take 2,000 Units by mouth daily., Disp: , Rfl:    desonide (DESOWEN) 0.05 % cream, Apply topically 2 (two) times  daily. (Patient taking differently: Apply 1 application. topically 2 (two) times daily as needed (rash).), Disp: 30 g, Rfl: 0   diphenhydrAMINE (BENADRYL) 25 mg capsule, Take 25 mg by mouth daily., Disp: , Rfl:    Multiple Vitamin (MULTI VITAMIN) TABS, Take 1 tablet by mouth daily., Disp: , Rfl:    vitamin B-12 (CYANOCOBALAMIN) 500 MCG tablet, Take 500 mcg by mouth daily., Disp: , Rfl:   Review of Systems:  Constitutional: Denies fever, chills, diaphoresis, appetite change and fatigue.  HEENT: Denies photophobia, eye pain, redness, mouth sores, trouble swallowing, neck pain, neck stiffness and tinnitus.   Respiratory: Denies SOB, DOE, cough, chest tightness,  and wheezing.   Cardiovascular: Denies chest pain, palpitations and leg swelling.  Gastrointestinal: Denies nausea, vomiting, abdominal pain, diarrhea, constipation, blood in stool and abdominal distention.  Genitourinary: Denies dysuria, urgency, frequency, hematuria, flank pain and difficulty urinating.  Endocrine: Denies: hot or cold intolerance, sweats, changes in hair or nails, polyuria, polydipsia. Musculoskeletal: Denies myalgias, back pain, joint swelling, arthralgias and gait problem.  Skin: Denies pallor, rash and wound.  Neurological: Denies dizziness, seizures, syncope, weakness, light-headedness, numbness and headaches.  Hematological: Denies adenopathy.  Easy bruising, personal or family bleeding history  Psychiatric/Behavioral: Denies suicidal ideation, mood changes, confusion, nervousness, sleep disturbance and agitation    Physical Exam: Vitals:   02/23/22 1554  BP: 98/64  Pulse: 64  Temp: 98.4 F (36.9 C)  TempSrc: Oral  SpO2: 98%  Weight: 115 lb (52.2 kg)    Body mass index is 19.74 kg/m.   Constitutional: NAD, calm, comfortable Eyes: PERRL, lids and conjunctivae normal ENMT: Mucous membranes are moist. Posterior pharynx is erythematous but clear of any exudate or lesions. Tympanic membrane is pearly  white, no erythema or bulging. Neck: No thyromegaly, there is some bilateral, right greater than left submandibular lymphadenopathy Respiratory: clear to auscultation bilaterally, no wheezing, no crackles. Normal respiratory effort. No accessory muscle use.  Cardiovascular: Regular rate and rhythm, no murmurs / rubs / gallops. No extremity edema.  Psychiatric: Normal judgment and insight. Alert and oriented x 3. Normal mood.    Impression and Plan:  Submandibular lymphadenopathy -Likely in response to recent URI/teeth issues. -Have advised observation over the next few weeks.   Time spent:20 minutes reviewing chart, interviewing and examining patient and formulating plan of care.     Lelon Frohlich, MD [facility] [place] [facility] at [facility]

## 2022-03-16 ENCOUNTER — Telehealth: Payer: Self-pay | Admitting: Internal Medicine

## 2022-03-16 DIAGNOSIS — E782 Mixed hyperlipidemia: Secondary | ICD-10-CM

## 2022-03-16 MED ORDER — ATORVASTATIN CALCIUM 20 MG PO TABS: ORAL_TABLET | ORAL | 1 refills | Status: DC

## 2022-03-29 ENCOUNTER — Other Ambulatory Visit: Payer: Self-pay | Admitting: Internal Medicine

## 2022-03-29 DIAGNOSIS — Z1231 Encounter for screening mammogram for malignant neoplasm of breast: Secondary | ICD-10-CM

## 2022-05-13 ENCOUNTER — Ambulatory Visit
Admission: RE | Admit: 2022-05-13 | Discharge: 2022-05-13 | Disposition: A | Discharge status: Home / Self Care | Payer: 59 | Source: Ambulatory Visit | Attending: Internal Medicine | Admitting: Internal Medicine

## 2022-05-13 DIAGNOSIS — Z1231 Encounter for screening mammogram for malignant neoplasm of breast: Secondary | ICD-10-CM

## 2022-06-09 ENCOUNTER — Ambulatory Visit: Payer: 59 | Admitting: Internal Medicine

## 2022-06-09 ENCOUNTER — Encounter: Payer: Self-pay | Admitting: Internal Medicine

## 2022-06-09 VITALS — BP 100/60 | HR 71 | Temp 97.9°F | Ht 64.0 in | Wt 112.9 lb

## 2022-06-09 DIAGNOSIS — Z23 Encounter for immunization: Secondary | ICD-10-CM | POA: Diagnosis not present

## 2022-06-09 DIAGNOSIS — R7303 Prediabetes: Secondary | ICD-10-CM | POA: Diagnosis not present

## 2022-06-09 DIAGNOSIS — E782 Mixed hyperlipidemia: Secondary | ICD-10-CM

## 2022-06-09 LAB — COMPREHENSIVE METABOLIC PANEL
ALT: 25 U/L (ref 0–35)
AST: 26 U/L (ref 0–37)
Albumin: 4.3 g/dL (ref 3.5–5.2)
Alkaline Phosphatase: 65 U/L (ref 39–117)
BUN: 17 mg/dL (ref 6–23)
CO2: 25 mEq/L (ref 19–32)
Calcium: 9.7 mg/dL (ref 8.4–10.5)
Chloride: 105 mEq/L (ref 96–112)
Creatinine, Ser: 0.82 mg/dL (ref 0.40–1.20)
GFR: 76.43 mL/min (ref >60.00)
Glucose, Bld: 86 mg/dL (ref 70–99)
Potassium: 3.6 mEq/L (ref 3.5–5.1)
Sodium: 140 mEq/L (ref 135–145)
Total Bilirubin: 0.6 mg/dL (ref 0.2–1.2)
Total Protein: 7.2 g/dL (ref 6.0–8.3)

## 2022-06-09 LAB — LIPID PANEL
Cholesterol: 135 mg/dL (ref 0–200)
HDL: 40.2 mg/dL (ref >39.00)
LDL Cholesterol: 71 mg/dL (ref 0–99)
NonHDL: 94.51
Total CHOL/HDL Ratio: 3
Triglycerides: 116 mg/dL (ref 0.0–149.0)
VLDL: 23.2 mg/dL (ref 0.0–40.0)

## 2022-06-09 LAB — HEMOGLOBIN A1C: Hgb A1c MFr Bld: 5.9 % (ref 4.6–6.5)

## 2022-06-09 MED ORDER — ATORVASTATIN CALCIUM 20 MG PO TABS: ORAL_TABLET | ORAL | 1 refills | Status: DC

## 2022-06-09 NOTE — Progress Notes (Signed)
Established Patient Office Visit     CC/Reason for Visit: Follow-up chronic conditions  HPI: Barbara [name] is a 61 y.o. female who is coming in today for the above mentioned reasons. Past Medical History is significant for: Hyperlipidemia and impaired glucose tolerance.  She has been doing well and has no acute concerns.  She has lost about 3 to 4 pounds as she is trying to manage her issues with lifestyle changes.  She is due for COVID, flu, Tdap vaccines.  She is requesting an atorvastatin refill.   Past Medical/Surgical History: Past Medical History:  Diagnosis Date   Allergy    Anxiety    Arthritis    Depression    Hyperlipidemia    no treatment    Pre-diabetes    no meds     Past Surgical History:  Procedure Laterality Date   GUM SURGERY     MANDIBLE FRACTURE SURGERY  1975   TONSILLECTOMY     TUBAL LIGATION     WISDOM TOOTH EXTRACTION      Social History:  reports that she quit smoking about 10 years ago. Her smoking use included cigarettes. She has a 22.50 pack-year smoking history. She has never used smokeless tobacco. She reports that she does not drink alcohol and does not use drugs.  Allergies: No Known Allergies  Family History:  Family History  Problem Relation Age of Onset   Dementia Mother    Heart attack Father    Diabetes Sister    Heart attack Brother    Diabetes Brother    Heart attack Maternal Grandfather    Diabetes Maternal Grandfather    Cancer Paternal Grandmother 34       breast   Breast cancer Paternal Grandmother    Heart attack Paternal Grandfather    Colon cancer Neg Hx    Colon polyps Neg Hx    Esophageal cancer Neg Hx    Stomach cancer Neg Hx    Rectal cancer Neg Hx      Current Outpatient Medications:    atorvastatin (LIPITOR) 20 MG tablet, TAKE 1 TABLET(20 MG) BY MOUTH DAILY, Disp: 90 tablet, Rfl: 1  Review of Systems:  Constitutional: Denies fever, chills, diaphoresis, appetite change and fatigue.  HEENT:  Denies photophobia, eye pain, redness, hearing loss, ear pain, congestion, sore throat, rhinorrhea, sneezing, mouth sores, trouble swallowing, neck pain, neck stiffness and tinnitus.   Respiratory: Denies SOB, DOE, cough, chest tightness,  and wheezing.   Cardiovascular: Denies chest pain, palpitations and leg swelling.  Gastrointestinal: Denies nausea, vomiting, abdominal pain, diarrhea, constipation, blood in stool and abdominal distention.  Genitourinary: Denies dysuria, urgency, frequency, hematuria, flank pain and difficulty urinating.  Endocrine: Denies: hot or cold intolerance, sweats, changes in hair or nails, polyuria, polydipsia. Musculoskeletal: Denies myalgias, back pain, joint swelling, arthralgias and gait problem.  Skin: Denies pallor, rash and wound.  Neurological: Denies dizziness, seizures, syncope, weakness, light-headedness, numbness and headaches.  Hematological: Denies adenopathy. Easy bruising, personal or family bleeding history  Psychiatric/Behavioral: Denies suicidal ideation, mood changes, confusion, nervousness, sleep disturbance and agitation    Physical Exam: Vitals:   06/09/22 0836  BP: 100/60  Pulse: 71  Temp: 97.9 F (36.6 C)  SpO2: 99%  Weight: 112 lb 14.4 oz (51.2 kg)  Height: '5\' 4"'$  (1.626 m)    Body mass index is 19.38 kg/m.   Constitutional: NAD, calm, comfortable Eyes: PERRL, lids and conjunctivae normal ENMT: Mucous membranes are moist.  Respiratory: clear to  auscultation bilaterally, no wheezing, no crackles. Normal respiratory effort. No accessory muscle use.  Cardiovascular: Regular rate and rhythm, no murmurs / rubs / gallops. No extremity edema. Psychiatric: Normal judgment and insight. Alert and oriented x 3. Normal mood.    Impression and Plan:  Need for Tdap vaccination - Plan: Tdap vaccine greater than or equal to 7yo IM  Mixed hyperlipidemia - Plan: atorvastatin (LIPITOR) 20 MG tablet, Comprehensive metabolic panel, Lipid  panel  Prediabetes - Plan: Hemoglobin A1c  -Tdap administered today, she declines COVID and flu despite counseling. -Check lipids, LFTs, renal function and A1c. -Atorvastatin refilled.  Time spent:31 minutes reviewing chart, interviewing and examining patient and formulating plan of care.     Lelon Frohlich, MD [facility] at [facility]

## 2022-10-29 ENCOUNTER — Ambulatory Visit: Payer: 59 | Admitting: Family Medicine

## 2022-10-29 VITALS — BP 110/62 | HR 72 | Temp 97.6°F | Wt 121.2 lb

## 2022-10-29 DIAGNOSIS — B029 Zoster without complications: Secondary | ICD-10-CM

## 2022-10-29 MED ORDER — VALACYCLOVIR HCL 1 G PO TABS: 1000.0000 mg | ORAL_TABLET | Freq: Three times a day (TID) | ORAL | 0 refills | Status: DC

## 2022-10-29 NOTE — Progress Notes (Signed)
   Subjective:    Patient ID: Barbara [name], female    DOB: [birth year], 62 y.o.   MRN: [id number]  HPI Here for an itchy burning rash on the right buttock that appeared yesterday. She has never had this before. She feels fine otherwise.    Review of Systems  Constitutional: Negative.   Respiratory: Negative.    Cardiovascular: Negative.   Skin:  Positive for rash.       Objective:   Physical Exam Constitutional:      Appearance: Normal appearance. She is not ill-appearing.  Cardiovascular:     Rate and Rhythm: Normal rate and regular rhythm.     Pulses: Normal pulses.     Heart sounds: Normal heart sounds.  Pulmonary:     Effort: Pulmonary effort is normal.     Breath sounds: Normal breath sounds.  Skin:    Comments: There is a small area of erythema and a cluster of tiny vesicles on the right buttock near the intergluteal fold   Neurological:     Mental Status: She is alert.           Assessment & Plan:  Shingles, treat with 10 days of Valtrex.  Alysia Penna, MD

## 2022-11-23 ENCOUNTER — Ambulatory Visit (INDEPENDENT_AMBULATORY_CARE_PROVIDER_SITE_OTHER): Payer: 59 | Admitting: Internal Medicine

## 2022-11-23 ENCOUNTER — Encounter: Payer: Self-pay | Admitting: Internal Medicine

## 2022-11-23 VITALS — BP 110/68 | HR 59 | Temp 97.6°F | Ht 64.0 in | Wt 120.1 lb

## 2022-11-23 DIAGNOSIS — E782 Mixed hyperlipidemia: Secondary | ICD-10-CM | POA: Diagnosis not present

## 2022-11-23 DIAGNOSIS — Z87891 Personal history of nicotine dependence: Secondary | ICD-10-CM | POA: Diagnosis not present

## 2022-11-23 DIAGNOSIS — Z0000 Encounter for general adult medical examination without abnormal findings: Secondary | ICD-10-CM | POA: Diagnosis not present

## 2022-11-23 DIAGNOSIS — R7303 Prediabetes: Secondary | ICD-10-CM

## 2022-11-23 DIAGNOSIS — Z124 Encounter for screening for malignant neoplasm of cervix: Secondary | ICD-10-CM

## 2022-11-23 DIAGNOSIS — Z1283 Encounter for screening for malignant neoplasm of skin: Secondary | ICD-10-CM

## 2022-11-23 LAB — CBC WITH DIFFERENTIAL/PLATELET
Basophils Absolute: 0.1 10*3/uL (ref 0.0–0.1)
Basophils Relative: 0.8 % (ref 0.0–3.0)
Eosinophils Absolute: 0.4 10*3/uL (ref 0.0–0.7)
Eosinophils Relative: 5.3 % — ABNORMAL HIGH (ref 0.0–5.0)
HCT: 42.2 % (ref 36.0–46.0)
Hemoglobin: 14 g/dL (ref 12.0–15.0)
Lymphocytes Relative: 34.4 % (ref 12.0–46.0)
Lymphs Abs: 2.4 10*3/uL (ref 0.7–4.0)
MCHC: 33.2 g/dL (ref 30.0–36.0)
MCV: 93.5 fl (ref 78.0–100.0)
Monocytes Absolute: 0.6 10*3/uL (ref 0.1–1.0)
Monocytes Relative: 8.8 % (ref 3.0–12.0)
Neutro Abs: 3.5 10*3/uL (ref 1.4–7.7)
Neutrophils Relative %: 50.7 % (ref 43.0–77.0)
Platelets: 251 10*3/uL (ref 150.0–400.0)
RBC: 4.52 Mil/uL (ref 3.87–5.11)
RDW: 14.5 % (ref 11.5–15.5)
WBC: 6.9 10*3/uL (ref 4.0–10.5)

## 2022-11-23 LAB — COMPREHENSIVE METABOLIC PANEL
ALT: 29 U/L (ref 0–35)
AST: 29 U/L (ref 0–37)
Albumin: 4.5 g/dL (ref 3.5–5.2)
Alkaline Phosphatase: 72 U/L (ref 39–117)
BUN: 15 mg/dL (ref 6–23)
CO2: 25 mEq/L (ref 19–32)
Calcium: 10 mg/dL (ref 8.4–10.5)
Chloride: 104 mEq/L (ref 96–112)
Creatinine, Ser: 0.8 mg/dL (ref 0.40–1.20)
GFR: 78.48 mL/min (ref >60.00)
Glucose, Bld: 76 mg/dL (ref 70–99)
Potassium: 3.8 mEq/L (ref 3.5–5.1)
Sodium: 139 mEq/L (ref 135–145)
Total Bilirubin: 0.6 mg/dL (ref 0.2–1.2)
Total Protein: 7.2 g/dL (ref 6.0–8.3)

## 2022-11-23 LAB — LIPID PANEL
Cholesterol: 136 mg/dL (ref 0–200)
HDL: 44.2 mg/dL (ref >39.00)
LDL Cholesterol: 63 mg/dL (ref 0–99)
NonHDL: 91.68
Total CHOL/HDL Ratio: 3
Triglycerides: 145 mg/dL (ref 0.0–149.0)
VLDL: 29 mg/dL (ref 0.0–40.0)

## 2022-11-23 LAB — HEMOGLOBIN A1C: Hgb A1c MFr Bld: 5.7 % (ref 4.6–6.5)

## 2022-11-23 MED ORDER — ATORVASTATIN CALCIUM 20 MG PO TABS: ORAL_TABLET | ORAL | 1 refills | Status: DC

## 2022-11-23 NOTE — Addendum Note (Signed)
Addended by: Westley Hummer B on: 11/23/2022 10:03 AM   Modules accepted: Orders

## 2022-11-23 NOTE — Progress Notes (Signed)
Established Patient Office Visit     CC/Reason for Visit: Annual preventive exam  HPI: Barbara [name] is a 62 y.o. female who is coming in today for the above mentioned reasons. Past Medical History is significant for: Hyperlipidemia and impaired glucose tolerance.  She has never had skin cancer screening.  She is overdue for Pap smear.  Colonoscopy and mammogram are up-to-date.  She is overdue for COVID, flu, shingles, RSV vaccinations.  She has routine eye and dental care.   Past Medical/Surgical History: Past Medical History:  Diagnosis Date   Allergy    Anxiety    Arthritis    Depression    Hyperlipidemia    no treatment    Pre-diabetes    no meds     Past Surgical History:  Procedure Laterality Date   GUM SURGERY     MANDIBLE FRACTURE SURGERY  1975   TONSILLECTOMY     TUBAL LIGATION     WISDOM TOOTH EXTRACTION      Social History:  reports that she quit smoking about 11 years ago. Her smoking use included cigarettes. She has a 22.50 pack-year smoking history. She has never used smokeless tobacco. She reports that she does not drink alcohol and does not use drugs.  Allergies: No Known Allergies  Family History:  Family History  Problem Relation Age of Onset   Dementia Mother    Heart attack Father    Diabetes Sister    Heart attack Brother    Diabetes Brother    Heart attack Maternal Grandfather    Diabetes Maternal Grandfather    Cancer Paternal Grandmother 61       breast   Breast cancer Paternal Grandmother    Heart attack Paternal Grandfather    Colon cancer Neg Hx    Colon polyps Neg Hx    Esophageal cancer Neg Hx    Stomach cancer Neg Hx    Rectal cancer Neg Hx      Current Outpatient Medications:    atorvastatin (LIPITOR) 20 MG tablet, TAKE 1 TABLET(20 MG) BY MOUTH DAILY, Disp: 90 tablet, Rfl: 1  Review of Systems:  Negative unless indicated in HPI.   Physical Exam: Vitals:   11/23/22 0921  BP: 110/68  Pulse: (!) 59  Temp:  97.6 F (36.4 C)  TempSrc: Oral  SpO2: 99%  Weight: 120 lb 1.6 oz (54.5 kg)  Height: '5\' 4"'$  (1.626 m)    Body mass index is 20.62 kg/m.   Physical Exam Vitals reviewed.  Constitutional:      General: She is not in acute distress.    Appearance: Normal appearance. She is not ill-appearing, toxic-appearing or diaphoretic.  HENT:     Head: Normocephalic.     Right Ear: Tympanic membrane, ear canal and external ear normal. There is no impacted cerumen.     Left Ear: Tympanic membrane, ear canal and external ear normal. There is no impacted cerumen.     Nose: Nose normal.     Mouth/Throat:     Mouth: Mucous membranes are moist.     Pharynx: Oropharynx is clear. No oropharyngeal exudate or posterior oropharyngeal erythema.  Eyes:     General: No scleral icterus.       Right eye: No discharge.        Left eye: No discharge.     Conjunctiva/sclera: Conjunctivae normal.     Pupils: Pupils are equal, round, and reactive to light.  Neck:     Vascular:  No carotid bruit.  Cardiovascular:     Rate and Rhythm: Normal rate and regular rhythm.     Pulses: Normal pulses.     Heart sounds: Normal heart sounds.  Pulmonary:     Effort: Pulmonary effort is normal. No respiratory distress.     Breath sounds: Normal breath sounds.  Abdominal:     General: Abdomen is flat. Bowel sounds are normal.     Palpations: Abdomen is soft.  Musculoskeletal:        General: Normal range of motion.     Cervical back: Normal range of motion.  Skin:    General: Skin is warm and dry.  Neurological:     General: No focal deficit present.     Mental Status: She is alert and oriented to person, place, and time. Mental status is at baseline.  Psychiatric:        Mood and Affect: Mood normal.        Behavior: Behavior normal.        Thought Content: Thought content normal.        Judgment: Judgment normal.      Impression and Plan:  Encounter for preventive health examination  Mixed hyperlipidemia -  Plan: CBC with Differential/Platelet, Comprehensive metabolic panel, Lipid panel, atorvastatin (LIPITOR) 20 MG tablet  Prediabetes - Plan: Hemoglobin A1c  Former smoker - Plan: Ambulatory Referral Lung Cancer Screening [facility]  -Recommend routine eye and dental care. -Healthy lifestyle discussed in detail. -Labs to be updated today. -Prostate cancer screening: N/A Health Maintenance  Topic Date Due   Screening for Lung Cancer  06/20/2019   Pap Smear  11/23/2022*   COVID-19 Vaccine (1) 12/09/2022*   Flu Shot  12/19/2022*   Zoster (Shingles) Vaccine (1 of 2) 02/23/2023*   Mammogram  05/13/2024   Colon Cancer Screening  12/24/2026   DTaP/Tdap/Td vaccine (3 - Td or Tdap) 06/09/2032   Hepatitis C Screening: USPSTF Recommendation to screen - Ages 20-79 yo.  Completed   HIV Screening  Completed   HPV Vaccine  Aged Out  *Topic was postponed. The date shown is not the original due date.    -Referrals to dermatology and GYN placed today.    Lelon Frohlich, MD [facility] at [facility]

## 2022-12-07 IMAGING — MG MM DIGITAL DIAGNOSTIC UNILAT*L* W/ TOMO W/ CAD
4 series · 4 of 12 positions shown · non-contrast
Comparison: Previous exam(s).

CLINICAL DATA: 59-year-old female presenting as a recall from
screening for possible left breast mass.

EXAM:
DIGITAL DIAGNOSTIC UNILATERAL LEFT MAMMOGRAM WITH TOMOSYNTHESIS AND
CAD; ULTRASOUND LEFT BREAST LIMITED
TECHNIQUE: Left digital diagnostic mammography and breast tomosynthesis was
performed. The images were evaluated with computer-aided detection.;
Targeted ultrasound examination of the left breast was performed.

[L MLO synth-2D]
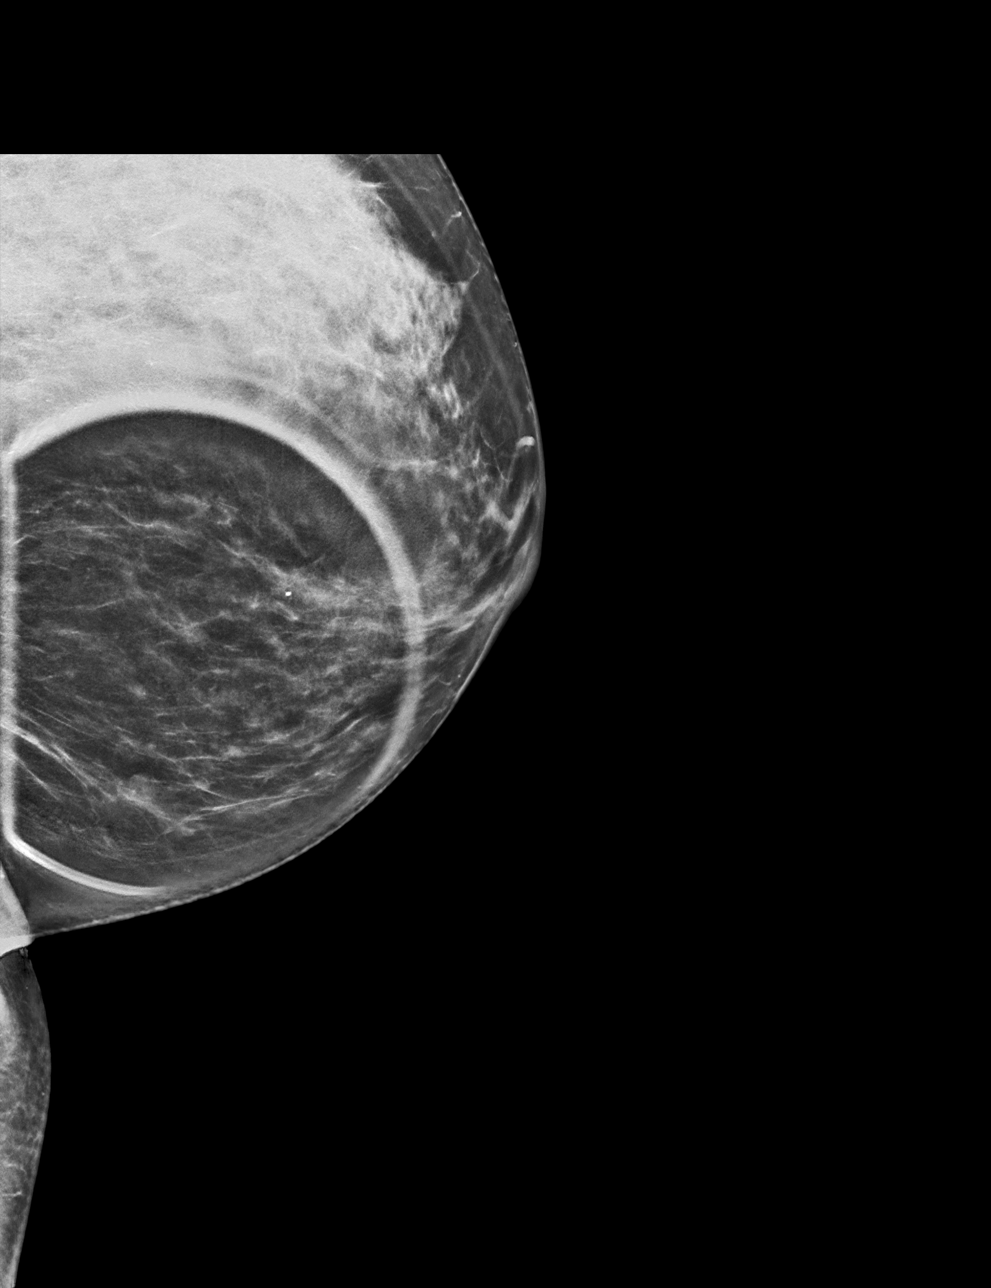

[L CC synth-2D]
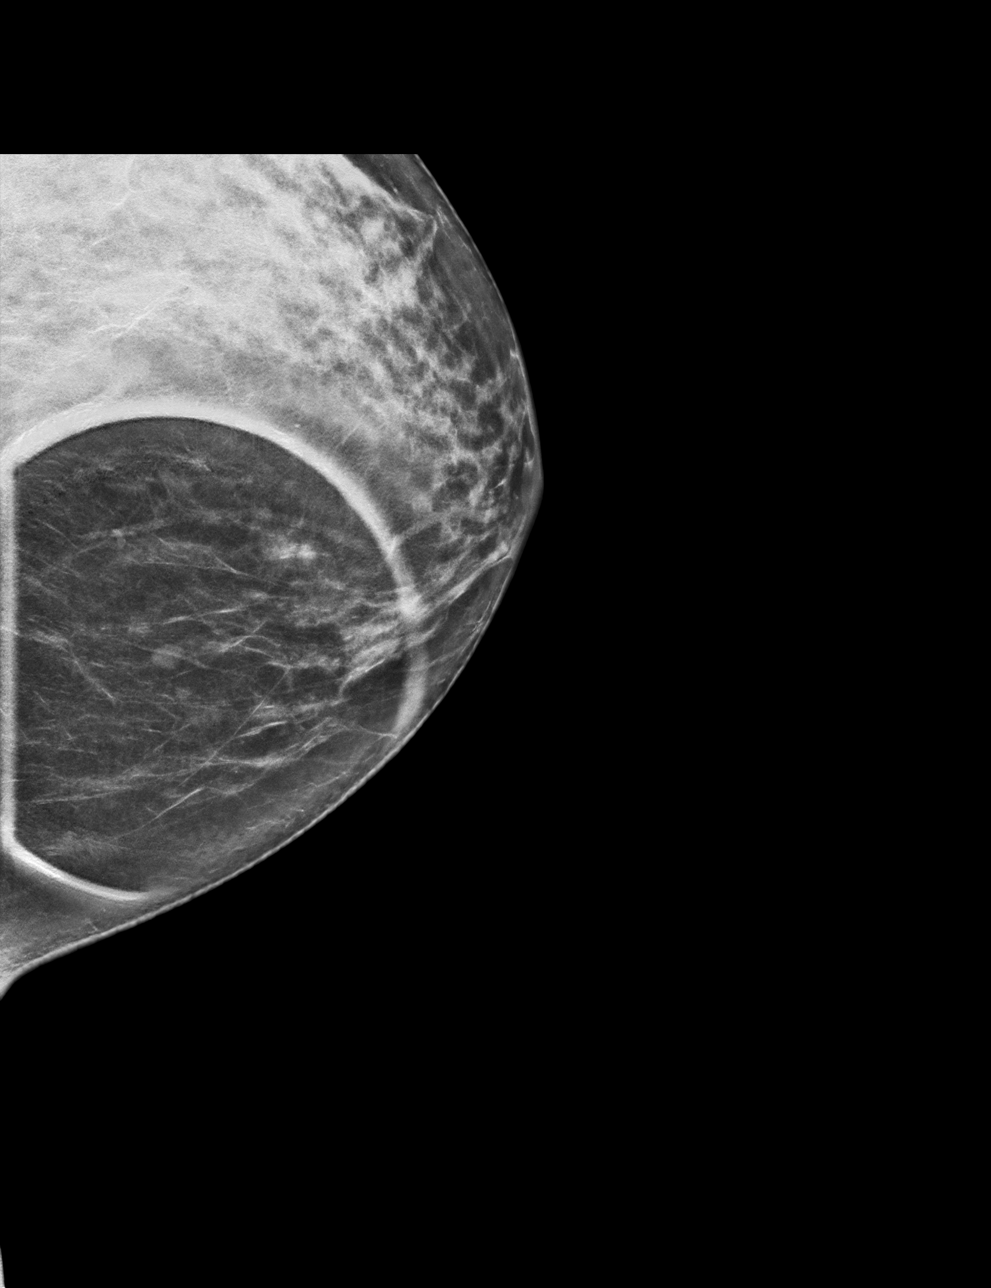

[L MLO tomo · tomo slice 31/61.0]
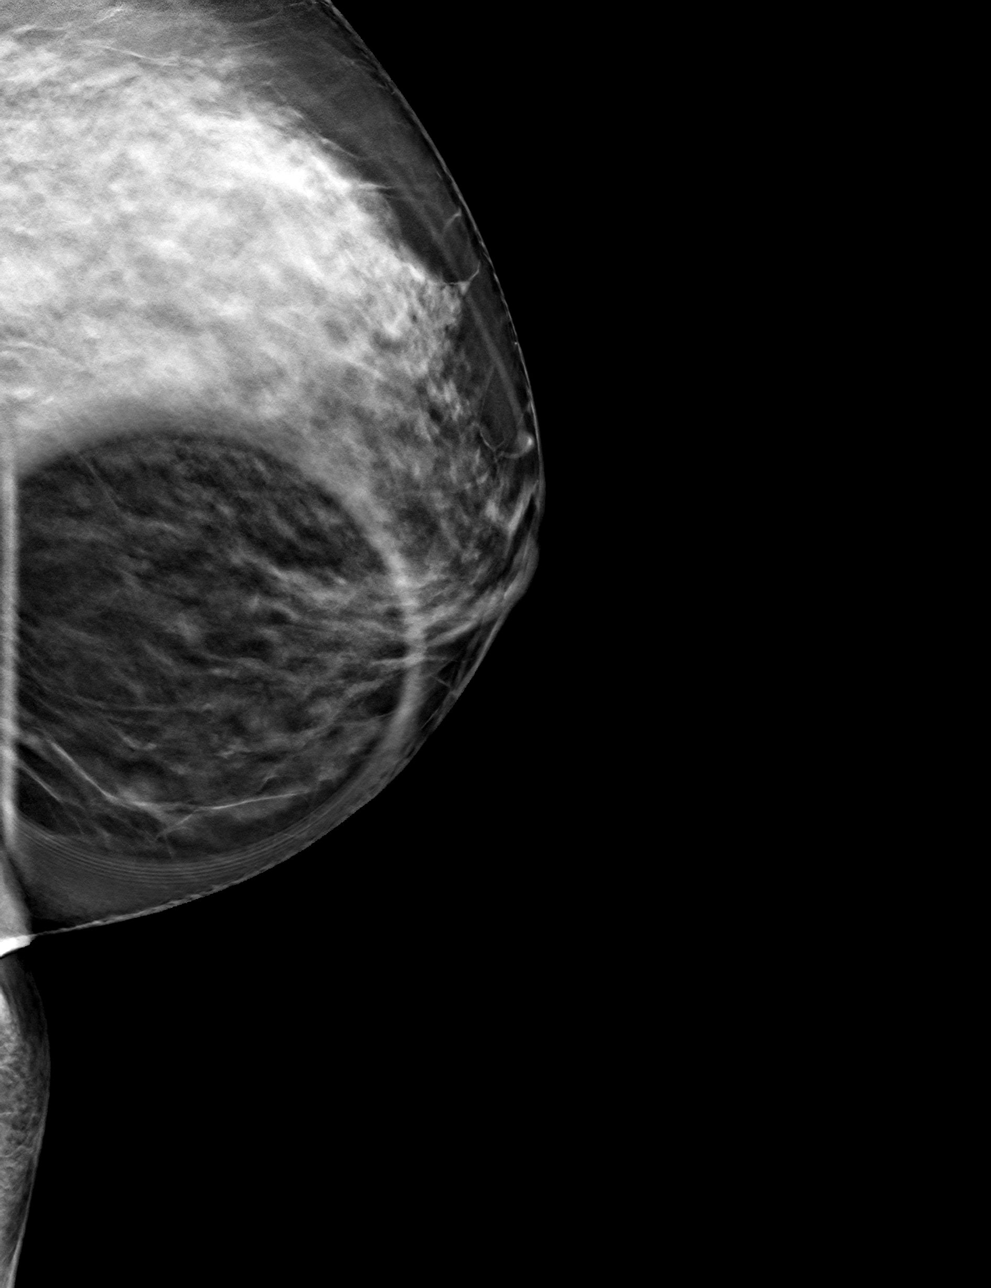

[L CC tomo · tomo slice 32/63.0]
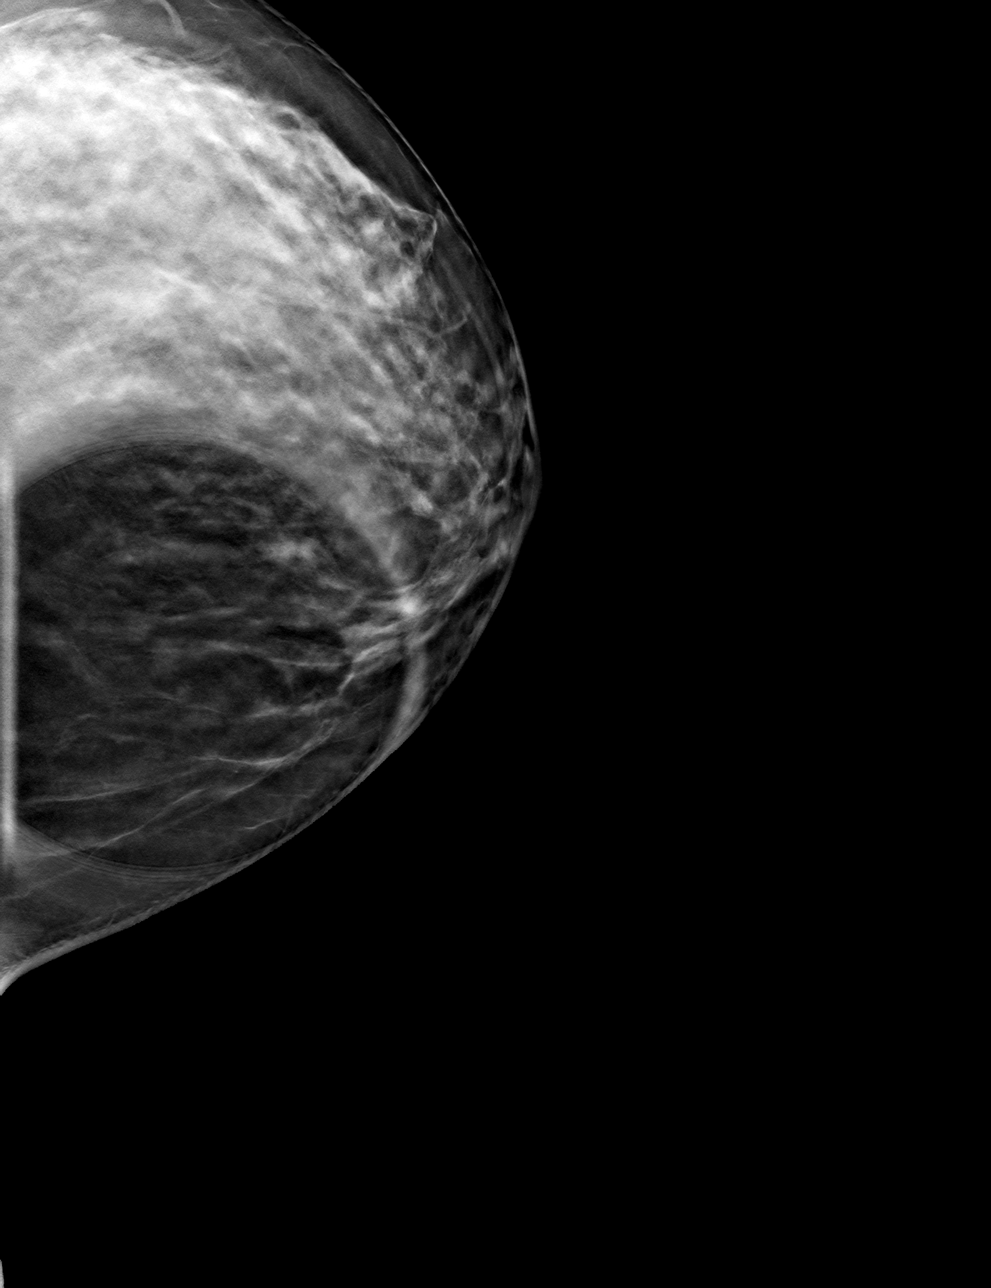

[4 of 12 positions shown; findings below may reference images not displayed]

ACR Breast Density Category c: The breast tissue is heterogeneously
dense, which may obscure small masses.
FINDINGS: Mammogram:

Spot compression tomosynthesis views of the left breast were
performed demonstrating persistence of an oval circumscribed
measuring approximately 0.5 cm in the lower inner left breast.

Ultrasound:

Targeted ultrasound performed in the left breast at 6 o'clock 4 cm
from the nipple demonstrating an oval circumscribed anechoic mass
measuring 0.5 x 0.4 x 0.4 cm, consistent with a benign simple cyst.
This corresponds to the mammographic finding.
IMPRESSION: Benign simple cyst in the inferior left breast.

RECOMMENDATION:
Screening mammogram in one year.(Code:F0-6-V5C)

I have discussed the findings and recommendations with the patient.
If applicable, a reminder letter will be sent to the patient
regarding the next appointment.

BI-RADS CATEGORY  2: Benign.

## 2022-12-07 IMAGING — US US BREAST*L* LIMITED INC AXILLA
1 series · 7 of 7 positions shown · non-contrast
Comparison: Previous exam(s).

CLINICAL DATA: 59-year-old female presenting as a recall from
screening for possible left breast mass.

EXAM:
DIGITAL DIAGNOSTIC UNILATERAL LEFT MAMMOGRAM WITH TOMOSYNTHESIS AND
CAD; ULTRASOUND LEFT BREAST LIMITED
TECHNIQUE: Left digital diagnostic mammography and breast tomosynthesis was
performed. The images were evaluated with computer-aided detection.;
Targeted ultrasound examination of the left breast was performed.

[Series 1: us breast*left* limited inc axilla · 0.05mm/px · 7 of 7 slices shown]
[im 1/7]
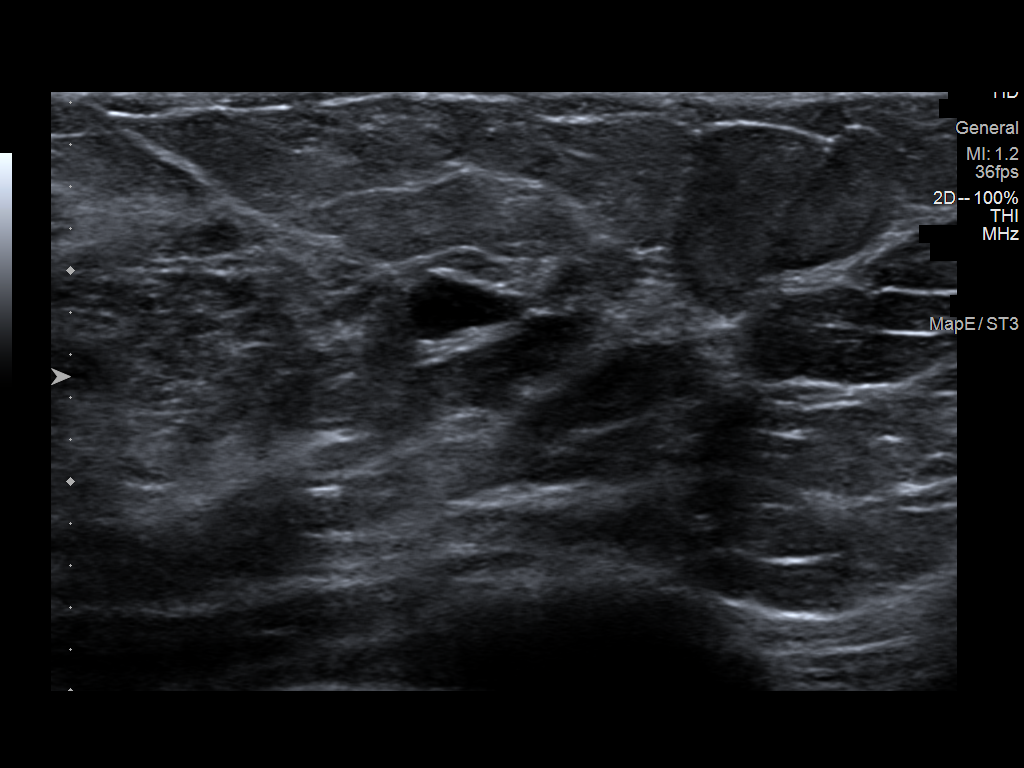
[im 2/7]
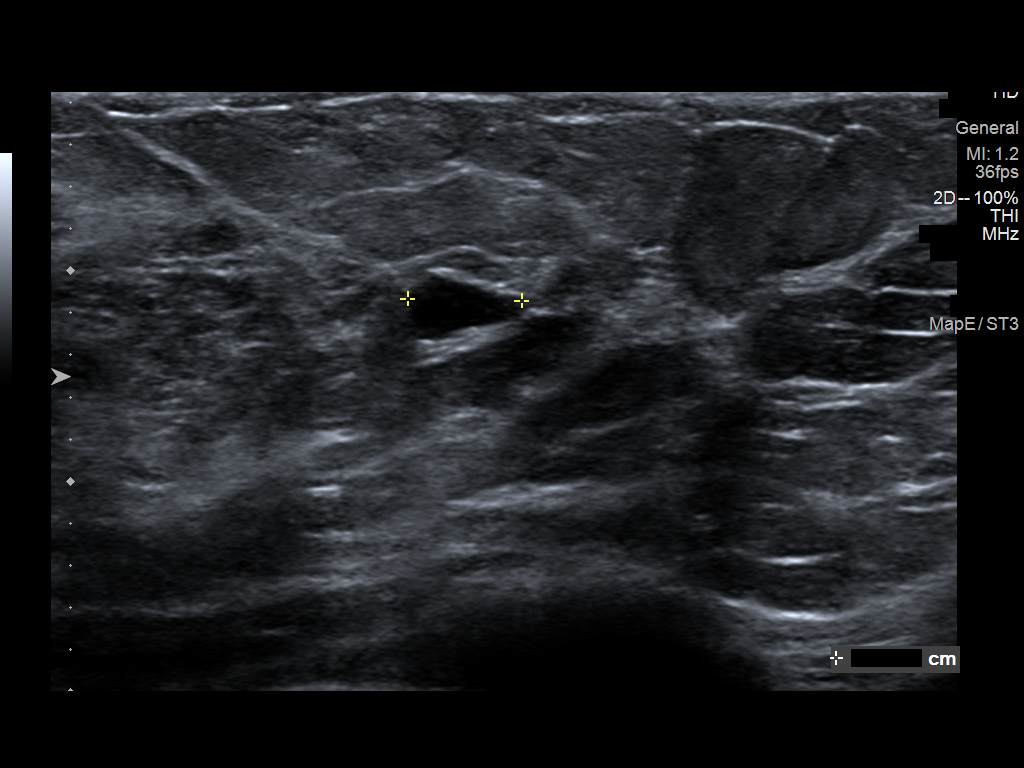
[im 3/7]
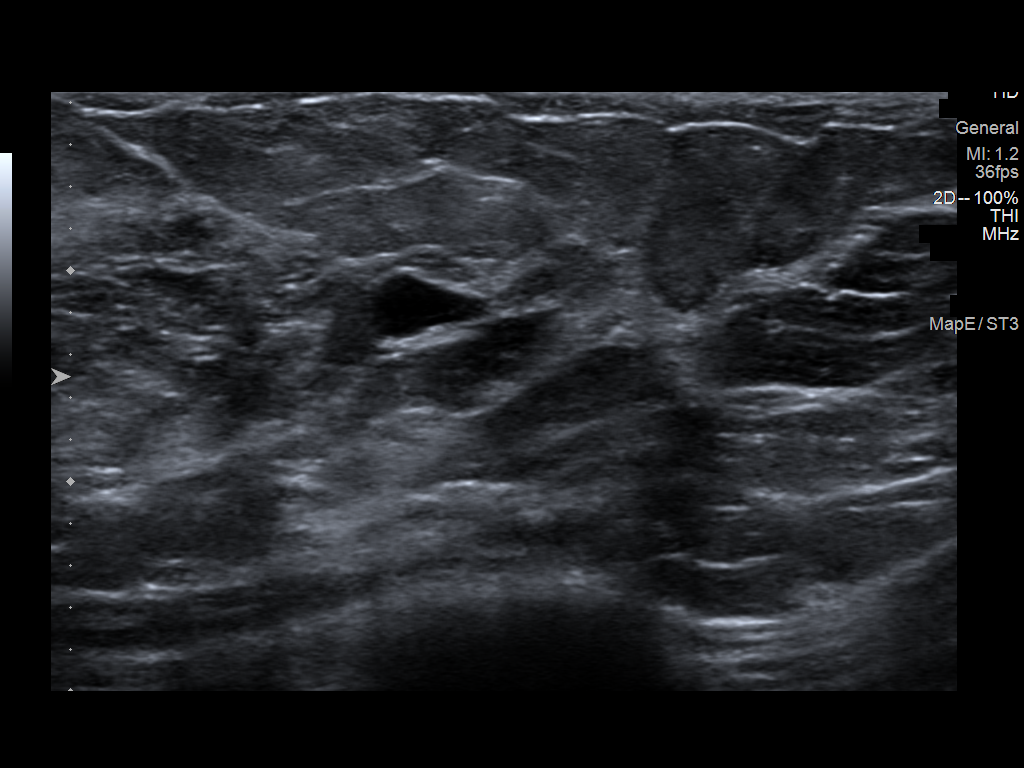
[im 4/7]
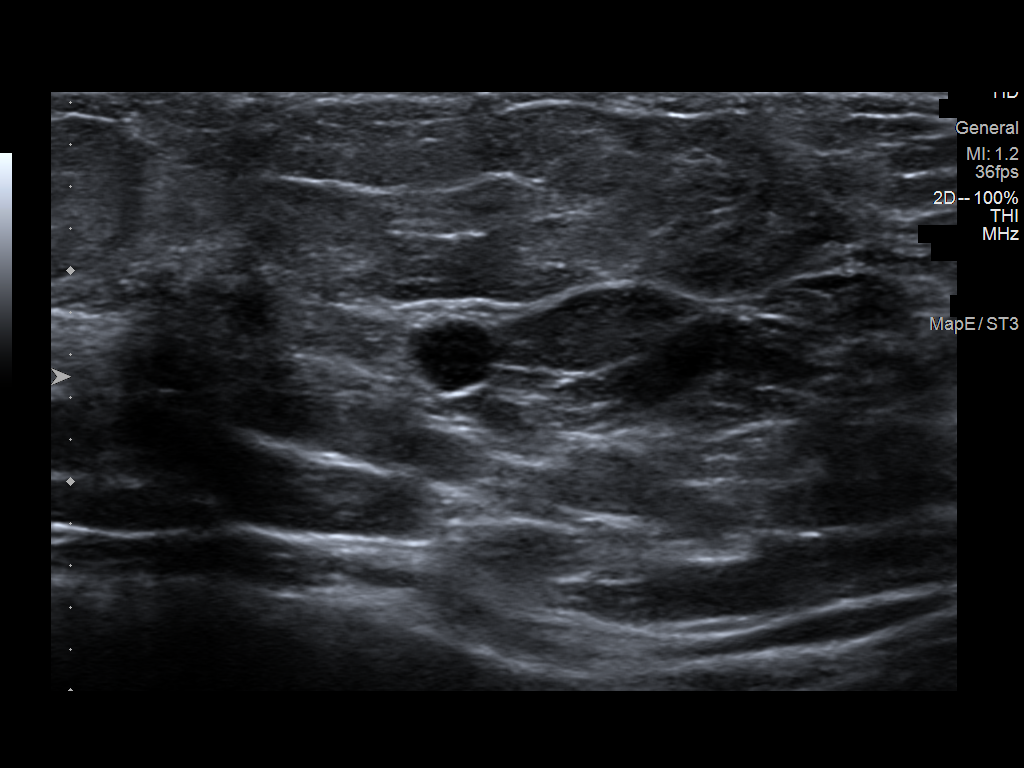
[im 5/7]
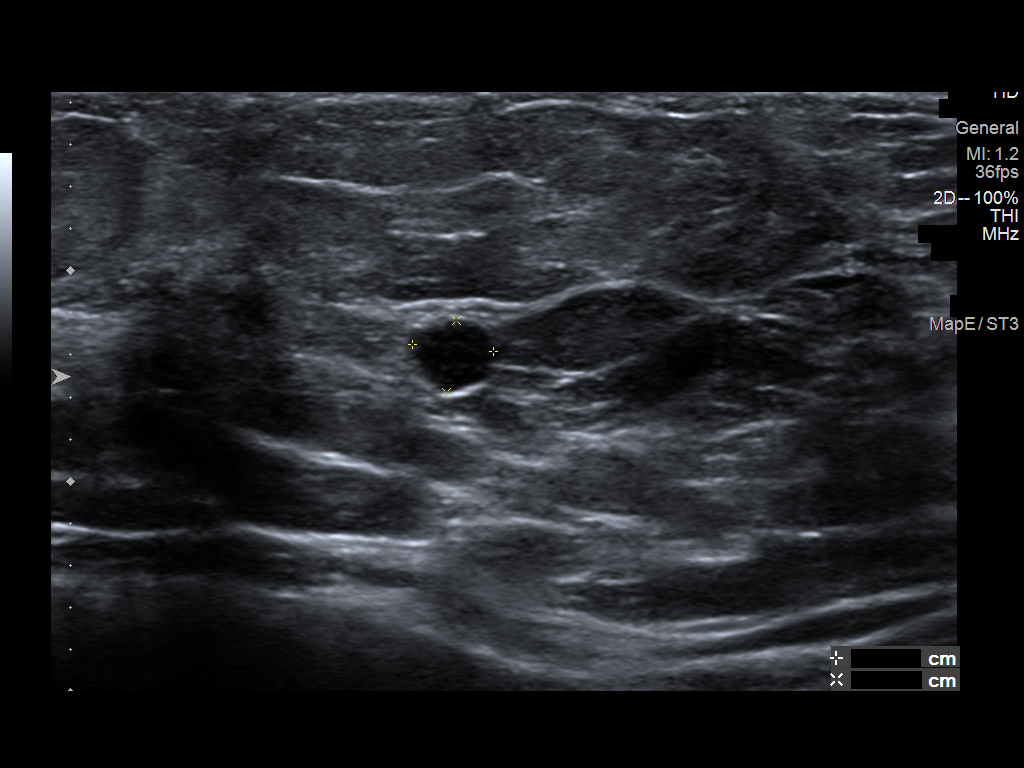
[im 6/7]
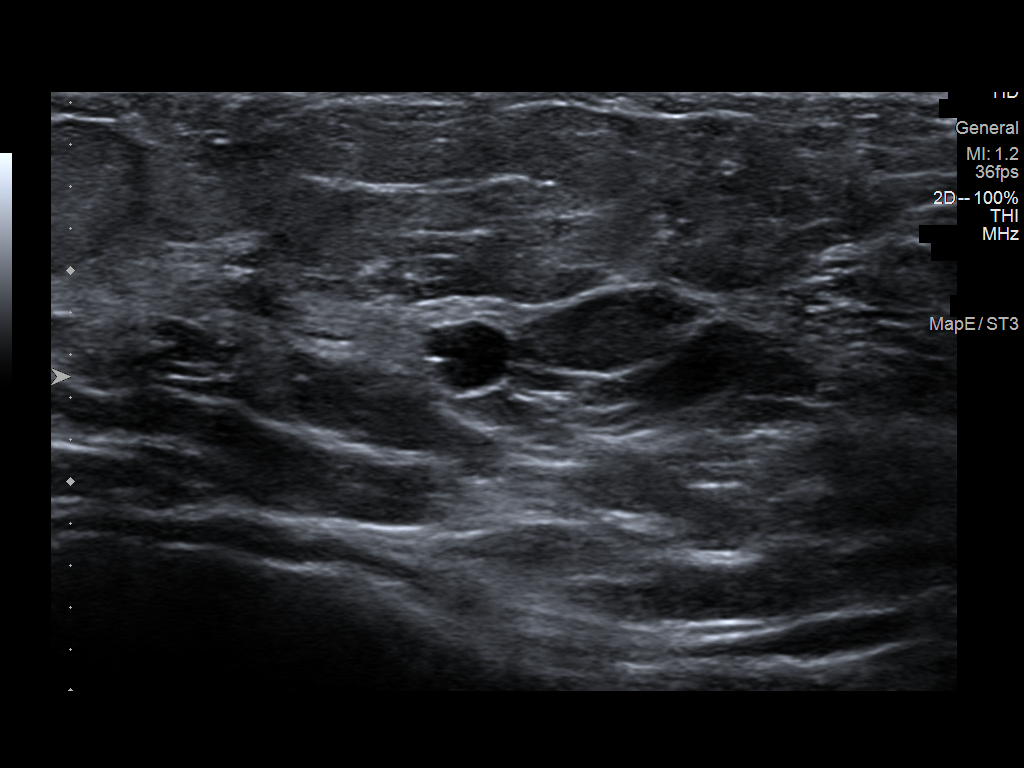
[im 7/7]
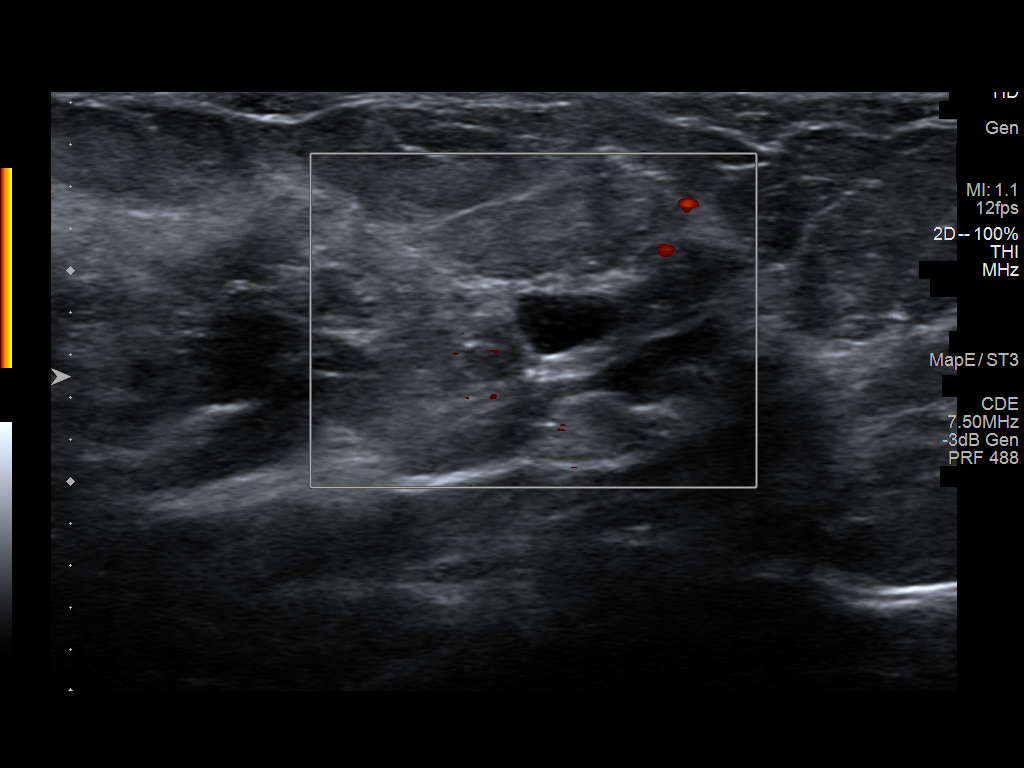

[7 of 7 positions shown; findings below may reference images not displayed]

ACR Breast Density Category c: The breast tissue is heterogeneously
dense, which may obscure small masses.
FINDINGS: Mammogram:

Spot compression tomosynthesis views of the left breast were
performed demonstrating persistence of an oval circumscribed
measuring approximately 0.5 cm in the lower inner left breast.

Ultrasound:

Targeted ultrasound performed in the left breast at 6 o'clock 4 cm
from the nipple demonstrating an oval circumscribed anechoic mass
measuring 0.5 x 0.4 x 0.4 cm, consistent with a benign simple cyst.
This corresponds to the mammographic finding.
IMPRESSION: Benign simple cyst in the inferior left breast.

RECOMMENDATION:
Screening mammogram in one year.(Code:F0-6-V5C)

I have discussed the findings and recommendations with the patient.
If applicable, a reminder letter will be sent to the patient
regarding the next appointment.

BI-RADS CATEGORY  2: Benign.

## 2023-02-10 ENCOUNTER — Encounter: Payer: Self-pay | Admitting: Internal Medicine

## 2023-02-10 ENCOUNTER — Ambulatory Visit: Payer: 59 | Admitting: Internal Medicine

## 2023-02-10 VITALS — BP 110/68 | HR 70 | Temp 98.0°F | Wt 119.1 lb

## 2023-02-10 DIAGNOSIS — E782 Mixed hyperlipidemia: Secondary | ICD-10-CM | POA: Diagnosis not present

## 2023-02-10 DIAGNOSIS — Z566 Other physical and mental strain related to work: Secondary | ICD-10-CM

## 2023-02-10 MED ORDER — ATORVASTATIN CALCIUM 20 MG PO TABS: ORAL_TABLET | ORAL | 1 refills | Status: DC

## 2023-02-10 NOTE — Progress Notes (Signed)
Established Patient Office Visit     CC/Reason for Visit: Stress at work  HPI: Barbara [name] is a 62 y.o. female who is coming in today for the above mentioned reasons.  She has been dealing with significant stress at work and is causing some anxiety and sleeping issues.  She is requesting a note to be off work for a week to help her reset.  She also is requesting information about CBT.  Also requesting medication refills.   Past Medical/Surgical History: Past Medical History:  Diagnosis Date   Allergy    Anxiety    Arthritis    Depression    Hyperlipidemia    no treatment    Pre-diabetes    no meds     Past Surgical History:  Procedure Laterality Date   GUM SURGERY     MANDIBLE FRACTURE SURGERY  1975   TONSILLECTOMY     TUBAL LIGATION     WISDOM TOOTH EXTRACTION      Social History:  reports that she quit smoking about 11 years ago. Her smoking use included cigarettes. She has a 22.50 pack-year smoking history. She has never used smokeless tobacco. She reports that she does not drink alcohol and does not use drugs.  Allergies: No Known Allergies  Family History:  Family History  Problem Relation Age of Onset   Dementia Mother    Heart attack Father    Diabetes Sister    Heart attack Brother    Diabetes Brother    Heart attack Maternal Grandfather    Diabetes Maternal Grandfather    Cancer Paternal Grandmother 64       breast   Breast cancer Paternal Grandmother    Heart attack Paternal Grandfather    Colon cancer Neg Hx    Colon polyps Neg Hx    Esophageal cancer Neg Hx    Stomach cancer Neg Hx    Rectal cancer Neg Hx      Current Outpatient Medications:    atorvastatin (LIPITOR) 20 MG tablet, TAKE 1 TABLET(20 MG) BY MOUTH DAILY, Disp: 90 tablet, Rfl: 1  Review of Systems:  Negative unless indicated in HPI.   Physical Exam: Vitals:   02/10/23 0829  BP: 110/68  Pulse: 70  Temp: 98 F (36.7 C)  TempSrc: Oral  SpO2: 99%  Weight:  119 lb 1.6 oz (54 kg)    Body mass index is 20.44 kg/m.   Physical Exam Vitals reviewed.  Constitutional:      Appearance: Normal appearance.  HENT:     Head: Normocephalic and atraumatic.  Eyes:     Conjunctiva/sclera: Conjunctivae normal.     Pupils: Pupils are equal, round, and reactive to light.  Skin:    General: Skin is warm and dry.  Neurological:     General: No focal deficit present.     Mental Status: She is alert and oriented to person, place, and time.  Psychiatric:        Mood and Affect: Mood normal. Affect is tearful.        Behavior: Behavior normal.        Thought Content: Thought content normal.        Judgment: Judgment normal.      Impression and Plan:  Stress at work  Mixed hyperlipidemia -     Atorvastatin Calcium; TAKE 1 TABLET(20 MG) BY MOUTH DAILY  Dispense: 90 tablet; Refill: 1   -Atorvastatin refills provided. -I have given her information on how  to schedule CBT and I have strongly encouraged her to do so. -Work note has been provided for 1 week.  Time spent:31 minutes reviewing chart, interviewing and examining patient and formulating plan of care.     Chaya Jan, MD [place] [facility] at [facility]

## 2023-03-03 ENCOUNTER — Other Ambulatory Visit (HOSPITAL_COMMUNITY)
Admission: RE | Admit: 2023-03-03 | Discharge: 2023-03-03 | Disposition: A | Discharge status: Home / Self Care | Payer: 59 | Source: Ambulatory Visit

## 2023-03-03 ENCOUNTER — Ambulatory Visit (INDEPENDENT_AMBULATORY_CARE_PROVIDER_SITE_OTHER): Payer: 59 | Admitting: Obstetrics and Gynecology

## 2023-03-03 ENCOUNTER — Encounter: Payer: Self-pay | Admitting: Obstetrics and Gynecology

## 2023-03-03 VITALS — BP 96/57 | HR 79 | Ht 64.0 in | Wt 118.8 lb

## 2023-03-03 DIAGNOSIS — Z01419 Encounter for gynecological examination (general) (routine) without abnormal findings: Secondary | ICD-10-CM

## 2023-03-03 NOTE — Progress Notes (Signed)
New Patient is in the office to establish care.  Hx of tubal ligation, postmenopausal  Pt reports last pap around 8 years ago, states that she had 1 abnormal pap in her 66's Last mammogram 05/13/2022 Pt denies any abnormal symptoms today.

## 2023-03-04 ENCOUNTER — Encounter: Payer: Self-pay | Admitting: Obstetrics and Gynecology

## 2023-03-04 LAB — CYTOLOGY - PAP
Adequacy: ABSENT
Comment: NEGATIVE
Diagnosis: NEGATIVE
High risk HPV: NEGATIVE

## 2023-03-05 NOTE — Progress Notes (Signed)
   WELL-WOMAN PHYSICAL & PAP Patient name: Barbara [name] MRN [id number]  Date of birth: [birth year] Chief Complaint:   New Patient (Initial Visit)  History of Present Illness:   Barbara [name] is a 62 y.o. G0P0000 Caucasian female being seen today for a routine well-woman exam.  Current complaints: needing AEX with pap  PCP: Chaya Jan, MD      does not desire labs No LMP recorded (lmp unknown). Patient is postmenopausal. The current method of family planning is post menopausal status.  Last pap 8 yrs ago. Results were: normal per patient Last mammogram: 05/13/2022. Results were: normal. Family h/o breast cancer: No Last colonoscopy: 2022. Results were: normal. Family h/o colorectal cancer: No Review of Systems:   Pertinent items are noted in HPI Denies any headaches, blurred vision, fatigue, shortness of breath, chest pain, abdominal pain, abnormal vaginal discharge/itching/odor/irritation, problems with periods, bowel movements, urination, or intercourse unless otherwise stated above. Pertinent History Reviewed:  Reviewed past medical,surgical, social and family history.  Reviewed problem list, medications and allergies. Physical Assessment:   Vitals:   03/03/23 1016  BP: (!) 96/57  Pulse: 79  Weight: 118 lb 12.8 oz (53.9 kg)  Height: 5\' 4"  (1.626 m)  Body mass index is 20.39 kg/m.        Physical Examination:   General appearance - well appearing, and in no distress  Mental status - alert, oriented to person, place, and time  Psych:  She has a normal mood and affect  Skin - warm and dry, normal color, no suspicious lesions noted  Chest - effort normal, all lung fields clear to auscultation bilaterally  Heart - normal rate and regular rhythm  Neck:  midline trachea, no thyromegaly or nodules  Breasts - breasts appear normal, no suspicious masses, no skin or nipple changes or  axillary nodes  Abdomen - soft, nontender, nondistended, no masses or  organomegaly  Pelvic - VULVA: normal appearing vulva with no masses, tenderness or lesions  VAGINA: normal appearing vagina with normal color and discharge, no lesions  CERVIX: normal appearing cervix without discharge or lesions, no CMT  Thin prep pap is done with HR HPV cotesting  UTERUS: uterus is felt to be normal size, shape, consistency and nontender   ADNEXA: No adnexal masses or tenderness noted.  Rectal - deferred  Extremities:  No swelling or varicosities noted  No results found for this or any previous visit (from the past 24 hour(s)).  Assessment & Plan:  1) Well-Woman Exam with Pap  2) Well woman exam with routine gynecological exam - Cytology - PAP( [place])   Labs/procedures today: pap  Mammogram in August 2024 or sooner if problems Colonoscopy in 2032 or sooner if problems  No orders of the defined types were placed in this encounter.   Meds: No orders of the defined types were placed in this encounter.   Follow-up: Return in about 1 year (around 03/02/2024) for Annual Exam.  Raelyn Mora MSN, CNM 03/03/2023 10:30 AM

## 2023-04-25 ENCOUNTER — Other Ambulatory Visit: Payer: Self-pay | Admitting: Internal Medicine

## 2023-04-25 DIAGNOSIS — Z1231 Encounter for screening mammogram for malignant neoplasm of breast: Secondary | ICD-10-CM

## 2023-05-11 ENCOUNTER — Ambulatory Visit: Payer: 59 | Admitting: Dermatology

## 2023-05-26 ENCOUNTER — Ambulatory Visit
Admission: RE | Admit: 2023-05-26 | Discharge: 2023-05-26 | Disposition: A | Discharge status: Home / Self Care | Payer: 59 | Source: Ambulatory Visit | Attending: Internal Medicine | Admitting: Internal Medicine

## 2023-05-26 DIAGNOSIS — Z1231 Encounter for screening mammogram for malignant neoplasm of breast: Secondary | ICD-10-CM

## 2023-06-29 ENCOUNTER — Ambulatory Visit: Payer: 59 | Admitting: Dermatology

## 2023-08-17 ENCOUNTER — Encounter: Payer: Self-pay | Admitting: Dermatology

## 2023-08-17 ENCOUNTER — Ambulatory Visit: Payer: 59 | Admitting: Dermatology

## 2023-08-17 VITALS — BP 137/70

## 2023-08-17 DIAGNOSIS — Z808 Family history of malignant neoplasm of other organs or systems: Secondary | ICD-10-CM

## 2023-08-17 DIAGNOSIS — L821 Other seborrheic keratosis: Secondary | ICD-10-CM | POA: Diagnosis not present

## 2023-08-17 DIAGNOSIS — L219 Seborrheic dermatitis, unspecified: Secondary | ICD-10-CM

## 2023-08-17 DIAGNOSIS — L570 Actinic keratosis: Secondary | ICD-10-CM

## 2023-08-17 MED ORDER — FLUOCINONIDE 0.05 % EX SOLN: 1.0000 | Freq: Two times a day (BID) | CUTANEOUS | 3 refills | Status: AC

## 2023-08-17 NOTE — Patient Instructions (Addendum)
Important Information  Due to recent changes in healthcare laws, you may see results of your pathology and/or laboratory studies on MyChart before the doctors have had a chance to review them. We understand that in some cases there may be results that are confusing or concerning to you. Please understand that not all results are received at the same time and often the doctors may need to interpret multiple results in order to provide you with the best plan of care or course of treatment. Therefore, we ask that you please give [place] 2 business days to thoroughly review all your results before contacting the office for clarification. Should we see a critical lab result, you will be contacted sooner.   If You Need Anything After Your Visit  If you have any questions or concerns for your doctor, please call our main line at [phone] If no one answers, please leave a voicemail as directed and we will return your call as soon as possible. Messages left after 4 pm will be answered the following business day.   You may also send [place] a message via MyChart. We typically respond to MyChart messages within 1-2 business days.  For prescription refills, please ask your pharmacy to contact our office. Our fax number is [phone].  If you have an urgent issue when the clinic is closed that cannot wait until the next business day, you can page your doctor at the number below.    Please note that while we do our best to be available for urgent issues outside of office hours, we are not available 24/7.   If you have an urgent issue and are unable to reach [place], you may choose to seek medical care at your doctor's office, retail clinic, urgent care center, or emergency room.  If you have a medical emergency, please immediately call 911 or go to the emergency department. In the event of inclement weather, please call our main line at [phone] for an update on the status of any delays or  closures.  Dermatology Medication Tips: Please keep the boxes that topical medications come in in order to help keep track of the instructions about where and how to use these. Pharmacies typically print the medication instructions only on the boxes and not directly on the medication tubes.   If your medication is too expensive, please contact our office at [phone] or send [place] a message through MyChart.   We are unable to tell what your co-pay for medications will be in advance as this is different depending on your insurance coverage. However, we may be able to find a substitute medication at lower cost or fill out paperwork to get insurance to cover a needed medication.   If a prior authorization is required to get your medication covered by your insurance company, please allow [place] 1-2 business days to complete this process.  Drug prices often vary depending on where the prescription is filled and some pharmacies may offer cheaper prices.  The website www.goodrx.com contains coupons for medications through different pharmacies. The prices here do not account for what the cost may be with help from insurance (it may be cheaper with your insurance), but the website can give you the price if you did not use any insurance.  - You can print the associated coupon and take it with your prescription to the pharmacy.  - You may also stop by our office during regular business hours and pick up a GoodRx coupon card.  - If  you need your prescription sent electronically to a different pharmacy, notify our office through [facility] or by phone at [phone]    Skin Education :   I counseled the patient regarding the following: Sun screen (SPF 30 or greater) should be applied during peak UV exposure (between 10am and 2pm) and reapplied after exercise or swimming.  The ABCDEs of melanoma were reviewed with the patient, and the importance of monthly self-examination of moles was emphasized.  Should any moles change in shape or color, or itch, bleed or burn, pt will contact our office for evaluation sooner then their interval appointment.  Plan: Sunscreen Recommendations I recommended a broad spectrum sunscreen with a SPF of 30 or higher. I explained that SPF 30 sunscreens block approximately 97 percent of the sun's harmful rays. Sunscreens should be applied at least 15 minutes prior to expected sun exposure and then every 2 hours after that as long as sun exposure continues. If swimming or exercising sunscreen should be reapplied every 45 minutes to an hour after getting wet or sweating. One ounce, or the equivalent of a shot glass full of sunscreen, is adequate to protect the skin not covered by a bathing suit. I also recommended a lip balm with a sunscreen as well. Sun protective clothing can be used in lieu of sunscreen but must be worn the entire time you are exposed to the sun's rays. Cryotherapy Aftercare  Wash gently with soap and water everyday.   Apply Vaseline and Band-Aid daily until healed.

## 2023-08-17 NOTE — Progress Notes (Signed)
   Follow-Up Visit   Subjective  Barbara [name] is a 62 y.o. female who presents for the following: spot check. Pt has no hx of skin cancer.  Family hx of BCC.  The patient has spots, moles and lesions to be evaluated, some may be new or changing.   The following portions of the chart were reviewed this encounter and updated as appropriate: medications, allergies, medical history  Review of Systems:  No other skin or systemic complaints except as noted in HPI or Assessment and Plan.  Objective  Well appearing patient in no apparent distress; mood and affect are within normal limits.   A focused examination was performed of the following areas:  Face and chest  Relevant exam findings are noted in the Assessment and Plan.  Head - Anterior (Face) Greasy scale on erythematous base on right eyebrow and glabella   Assessment & Plan   I counseled the patient regarding the following: Skin Care: Seborrheic Keratoses are benign. No treatment is necessary. Expectations: Seborrheic Keratoses are benign warty growths. Patients get more of them as they age.   SEBORRHEIC KERATOSIS - Stuck-on, waxy, tan-brown papules and/or plaques  - Benign-appearing - Discussed benign etiology and prognosis. - Observe - Call for any changes  SEBORRHEIC DERMATITIS Exam: Pink patches with greasy scale at right eyebrow and glabella  Flared  Seborrheic Dermatitis is a chronic persistent rash characterized by pinkness and scaling most commonly of the mid face but also can occur on the scalp (dandruff), ears; mid chest, mid back and groin.  It tends to be exacerbated by stress and cooler weather.  People who have neurologic disease may experience new onset or exacerbation of existing seborrheic dermatitis.  The condition is not curable but treatable and can be controlled.  Treatment Plan: - Lidex solution   Seborrheic dermatitis Head - Anterior (Face)  fluocinonide (LIDEX) 0.05 % external  solution - Head - Anterior (Face) Apply 1 Application topically 2 (two) times daily. As needed  AK (actinic keratosis) Right Nasal Sidewall  Destruction of lesion - Right Nasal Sidewall Complexity: simple   Destruction method: cryotherapy   Timeout:  patient name, date of birth, surgical site, and procedure verified Lesion destroyed using liquid nitrogen: Yes   Region frozen until ice ball extended beyond lesion: Yes   Outcome: patient tolerated procedure well with no complications   Post-procedure details: wound care instructions given      Return in about 3 months (around 11/17/2023) for AK f/u.  I, Tillie Fantasia, CMA, am acting as scribe for Gwenith Daily, MD.   Documentation: I have reviewed the above documentation for accuracy and completeness, and I agree with the above.  Gwenith Daily, MD

## 2023-08-25 ENCOUNTER — Telehealth: Payer: Self-pay | Admitting: Internal Medicine

## 2023-08-25 DIAGNOSIS — E782 Mixed hyperlipidemia: Secondary | ICD-10-CM

## 2023-08-25 MED ORDER — ATORVASTATIN CALCIUM 20 MG PO TABS: ORAL_TABLET | ORAL | 1 refills | Status: DC

## 2023-08-25 NOTE — Telephone Encounter (Signed)
Refills sent

## 2023-08-25 NOTE — Telephone Encounter (Signed)
Pt needs a refill on her Atorvastatin.    Pharmacy- [facility]

## 2023-10-18 ENCOUNTER — Ambulatory Visit: Payer: 59 | Admitting: Dermatology

## 2023-11-08 ENCOUNTER — Encounter: Payer: Self-pay | Admitting: *Deleted

## 2024-01-27 ENCOUNTER — Ambulatory Visit: Payer: Self-pay

## 2024-01-27 ENCOUNTER — Encounter: Payer: Self-pay | Admitting: Family Medicine

## 2024-01-27 ENCOUNTER — Ambulatory Visit: Admitting: Family Medicine

## 2024-01-27 VITALS — BP 118/72 | HR 85 | Temp 98.6°F | Ht 64.0 in | Wt 125.8 lb

## 2024-01-27 DIAGNOSIS — J069 Acute upper respiratory infection, unspecified: Secondary | ICD-10-CM

## 2024-01-27 DIAGNOSIS — J3489 Other specified disorders of nose and nasal sinuses: Secondary | ICD-10-CM

## 2024-01-27 DIAGNOSIS — B309 Viral conjunctivitis, unspecified: Secondary | ICD-10-CM

## 2024-01-27 MED ORDER — ALBUTEROL SULFATE HFA 108 (90 BASE) MCG/ACT IN AERS: 2.0000 | INHALATION_SPRAY | Freq: Four times a day (QID) | RESPIRATORY_TRACT | 0 refills | Status: AC | PRN

## 2024-01-27 MED ORDER — AMOXICILLIN-POT CLAVULANATE 500-125 MG PO TABS: 1.0000 | ORAL_TABLET | Freq: Two times a day (BID) | ORAL | 0 refills | Status: AC

## 2024-01-27 NOTE — Telephone Encounter (Signed)
 Chief Complaint: cough Symptoms: cough, sinus pressure, headache Frequency: x 5 days Pertinent Negatives: Patient denies chest pain Disposition: [] ED /[] Urgent Care (no appt availability in office) / [x] Appointment(In office/virtual)/ []  [facility]/ [] Home Care/ [] Refused Recommended Disposition /[] [facility]/ []  Follow-up with PCP Additional Notes: pt states that she is having sinus headache, pressure in her teeth, nasal congestion. States started around 5 days ago. States started with cough and then the congestion. States the didn't take her temp but feels like she ran a fever.   Copied from CRM [id number]. Topic: Clinical - Red Word Triage >> Jan 27, 2024  8:06 AM Alethia Huxley E wrote: [facility] that prompted transfer to Nurse Triage: Chest tightness. Patient experiencing sinus infection symptoms. Stated her chest has felt tight, she has a cough, stuffy nose, and some teeth pain. Symptoms have been going on for 5 days. Reason for Disposition  [1] Using nasal washes and pain medicine > 24 hours AND [2] sinus pain (around cheekbone or eye) persists  Answer Assessment - Initial Assessment Questions 1. ONSET: "When did the cough begin?"      5 days 2. SEVERITY: "How bad is the cough today?"      severe 3. SPUTUM: "Describe the color of your sputum" (none, dry cough; clear, white, yellow, green)     dry 4. HEMOPTYSIS: "Are you coughing up any blood?" If so ask: "How much?" (flecks, streaks, tablespoons, etc.)     no 5. DIFFICULTY BREATHING: "Are you having difficulty breathing?" If Yes, ask: "How bad is it?" (e.g., mild, moderate, severe)    - MILD: No SOB at rest, mild SOB with walking, speaks normally in sentences, can lie down, no retractions, pulse < 100.    - MODERATE: SOB at rest, SOB with minimal exertion and prefers to sit, cannot lie down flat, speaks in phrases, mild retractions, audible wheezing, pulse 100-120.    - SEVERE: Very SOB at rest, speaks in single  words, struggling to breathe, sitting hunched forward, retractions, pulse > 120      mild 6. FEVER: "Do you have a fever?" If Yes, ask: "What is your temperature, how was it measured, and when did it start?"     unknown 7. CARDIAC HISTORY: "Do you have any history of heart disease?" (e.g., heart attack, congestive heart failure)      no 8. LUNG HISTORY: "Do you have any history of lung disease?"  (e.g., pulmonary embolus, asthma, emphysema)     no 9. PE RISK FACTORS: "Do you have a history of blood clots?" (or: recent major surgery, recent prolonged travel, bedridden)     no 10. OTHER SYMPTOMS: "Do you have any other symptoms?" (e.g., runny nose, wheezing, chest pain)       Headache, nasal congestion  Protocols used: Cough - Acute Non-Productive-A-AH

## 2024-01-27 NOTE — Progress Notes (Signed)
 Established Patient Office Visit   Subjective  Patient ID: Barbara [name], female    DOB: 21-Apr-1961  Age: 63 y.o. MRN: [id number]  Chief Complaint  Patient presents with   Cough    Patient came in today for Cough, congestion, headaches, chest tightness, red swollen eyes, sinus, grey mucus, started 5 days ago     Pt is a 63 yo female followed by Dr. Ival Marines and seen for acute concern.  Patient endorses, sinus pressure, chest tightness, mild sore throat, ear pressure, hoarse voice, nasal congestion x 5 days.  Patient had chills and subjective fever 1 night.  Cough increases at night when laying down.  Feels increased pressure in sinuses when bending over.  L eye started becoming itchy, red, with clear drainage.  No pain, thick drainage.  Sick contacts include her in-laws who were visiting over the weekend from [place] .  Patient tried Mucinex and Afrin.  Also tried NyQuil which caused insomnia.  Patient requesting a refill of albuterol  inhaler.    Patient Active Problem List   Diagnosis Date Noted   Viral upper respiratory tract infection 05/25/2021   Allergic conjunctivitis of both eyes 05/25/2021   Cough 05/25/2021   Wheezing 05/25/2021   Right upper quadrant abdominal pain 05/11/2021   Intractable vomiting 05/11/2021   Screening, poisoning, heavy metal 05/11/2021   Prediabetes 12/18/2019   Hyperlipidemia 12/18/2019   Hypertriglyceridemia 12/18/2019   Low HDL (under 40) 12/18/2019   Elevated alanine aminotransferase (ALT) level 12/18/2019   GAD (generalized anxiety disorder) 11/08/2018   Depression, major, single episode, moderate (HCC) 10/11/2018   Eczema 10/11/2018   Insomnia 10/11/2018   Abnormal CBC 06/12/2018   Low blood pressure reading 06/12/2018   Healthcare maintenance 05/18/2018   Screening for breast cancer 05/18/2018   Axillary pain, right 05/18/2018   Past Medical History:  Diagnosis Date   Allergy    Anxiety    Arthritis    Depression     Hyperlipidemia    no treatment    Pre-diabetes    no meds    Past Surgical History:  Procedure Laterality Date   GUM SURGERY     MANDIBLE FRACTURE SURGERY  1975   TONSILLECTOMY     TUBAL LIGATION     WISDOM TOOTH EXTRACTION     Social History   Tobacco Use   Smoking status: Former    Current packs/day: 0.00    Average packs/day: 0.8 packs/day for 30.0 years (22.5 ttl pk-yrs)    Types: Cigarettes    Start date: 09/20/1981    Quit date: 09/21/2011    Years since quitting: 12.3   Smokeless tobacco: Never  Vaping Use   Vaping status: Never Used  Substance Use Topics   Alcohol use: Never   Drug use: Never   Family History  Problem Relation Age of Onset   Dementia Mother    Heart attack Father    Diabetes Sister    Heart attack Brother    Diabetes Brother    Heart attack Maternal Grandfather    Diabetes Maternal Grandfather    Cancer Paternal Grandmother 35       breast   Breast cancer Paternal Grandmother    Heart attack Paternal Grandfather    Colon cancer Neg Hx    Colon polyps Neg Hx    Esophageal cancer Neg Hx    Stomach cancer Neg Hx    Rectal cancer Neg Hx    No Known Allergies    ROS Negative unless  stated above    Objective:      BP 118/72 (BP Location: Left Arm, Patient Position: Sitting, Cuff Size: Normal)   Pulse 85   Temp 98.6 F (37 C) (Oral)   Ht 5\' 4"  (1.626 m)   Wt 125 lb 12.8 oz (57.1 kg)   LMP  (LMP Unknown)   SpO2 95%   BMI 21.59 kg/m  BP Readings from Last 3 Encounters:  01/27/24 118/72  08/17/23 137/70  03/03/23 (!) 96/57   Wt Readings from Last 3 Encounters:  01/27/24 125 lb 12.8 oz (57.1 kg)  03/03/23 118 lb 12.8 oz (53.9 kg)  02/10/23 119 lb 1.6 oz (54 kg)      Physical Exam Constitutional:      General: She is not in acute distress.    Appearance: Normal appearance.  HENT:     Head: Normocephalic and atraumatic.     Nose: Nose normal.     Mouth/Throat:     Mouth: Mucous membranes are moist.  Eyes:      Conjunctiva/sclera:     Left eye: Left conjunctiva is injected.     Comments: Edema underneath b/l eyes.  L eye with mild erythema and clear drainage.  Cardiovascular:     Rate and Rhythm: Normal rate and regular rhythm.     Heart sounds: Normal heart sounds. No murmur heard.    No gallop.  Pulmonary:     Effort: Pulmonary effort is normal. No respiratory distress.     Breath sounds: Normal breath sounds. No transmitted upper airway sounds. No decreased breath sounds, wheezing, rhonchi or rales.     Comments: Mild dry cough. Skin:    General: Skin is warm and dry.  Neurological:     Mental Status: She is alert and oriented to person, place, and time.        02/10/2023    9:32 AM 06/09/2022    8:43 AM 02/23/2022    4:03 PM  Depression screen PHQ 2/9  Decreased Interest 2 0 0  Down, Depressed, Hopeless 3 0 1  PHQ - 2 Score 5 0 1  Altered sleeping 3  1  Tired, decreased energy 3  1  Change in appetite 2  0  Feeling bad or failure about yourself  3  0  Trouble concentrating 3  0  Moving slowly or fidgety/restless 0  0  Suicidal thoughts 0  0  PHQ-9 Score 19  3  Difficult doing work/chores Somewhat difficult  Not difficult at all      02/10/2023    9:32 AM 05/13/2021    4:03 PM 05/07/2021    3:25 PM 08/01/2019   10:15 AM  GAD 7 : Generalized Anxiety Score  Nervous, Anxious, on Edge 3 2 1 1   Control/stop worrying 3 2 1 3   Worry too much - different things 3 2 1 3   Trouble relaxing 3 2 1 3   Restless 3 2 1 1   Easily annoyed or irritable 3 2 1 1   Afraid - awful might happen 3 2 1  0  Total GAD 7 Score 21 14 7 12   Anxiety Difficulty Somewhat difficult Somewhat difficult  Somewhat difficult     No results found for any visits on 01/27/24.    Assessment & Plan:  Viral URI with cough -     Albuterol  Sulfate HFA; Inhale 2 puffs into the lungs every 6 (six) hours as needed for wheezing or shortness of breath.  Dispense: 8 g; Refill: 0  Viral  conjunctivitis of left eye  Sinus  pressure -     Amoxicillin-Pot Clavulanate; Take 1 tablet by mouth in the morning and at bedtime for 7 days.  Dispense: 14 tablet; Refill: 0  Acute URI symptoms.  Continue supportive care with OTC cough/cold medications, rest, hydration, etc.  Conjunctivitis of left eye likely viral 2/2 clear drainage.  Continue supportive care.  Notify clinic for changes in drainage, crusting, increased irritation.  Given wait-and-see Rx for Augmentin for development of sinusitis over the weekend.  Lungs clear on exam.  Albuterol  inhaler refilled.  Return if symptoms worsen or fail to improve.   Viola Greulich, MD

## 2024-01-27 NOTE — Telephone Encounter (Signed)
 Patient was scheduled for an appt today with Dr Arliss Lam.

## 2024-02-15 ENCOUNTER — Encounter: Payer: Self-pay | Admitting: Internal Medicine

## 2024-02-15 ENCOUNTER — Ambulatory Visit: Admitting: Internal Medicine

## 2024-02-15 VITALS — BP 98/64 | HR 73 | Temp 97.8°F | Wt 123.4 lb

## 2024-02-15 DIAGNOSIS — E782 Mixed hyperlipidemia: Secondary | ICD-10-CM

## 2024-02-15 DIAGNOSIS — J069 Acute upper respiratory infection, unspecified: Secondary | ICD-10-CM

## 2024-02-15 LAB — COMPREHENSIVE METABOLIC PANEL WITH GFR
ALT: 20 U/L (ref 0–35)
AST: 22 U/L (ref 0–37)
Albumin: 4.6 g/dL (ref 3.5–5.2)
Alkaline Phosphatase: 76 U/L (ref 39–117)
BUN: 19 mg/dL (ref 6–23)
CO2: 30 meq/L (ref 19–32)
Calcium: 9.8 mg/dL (ref 8.4–10.5)
Chloride: 105 meq/L (ref 96–112)
Creatinine, Ser: 0.98 mg/dL (ref 0.40–1.20)
GFR: 60.99 mL/min (ref >60.00)
Glucose, Bld: 96 mg/dL (ref 70–99)
Potassium: 3.8 meq/L (ref 3.5–5.1)
Sodium: 141 meq/L (ref 135–145)
Total Bilirubin: 0.5 mg/dL (ref 0.2–1.2)
Total Protein: 7.3 g/dL (ref 6.0–8.3)

## 2024-02-15 LAB — LIPID PANEL
Cholesterol: 145 mg/dL (ref 0–200)
HDL: 40.6 mg/dL (ref >39.00)
LDL Cholesterol: 68 mg/dL (ref 0–99)
NonHDL: 104.08
Total CHOL/HDL Ratio: 4
Triglycerides: 179 mg/dL — ABNORMAL HIGH (ref 0.0–149.0)
VLDL: 35.8 mg/dL (ref 0.0–40.0)

## 2024-02-15 MED ORDER — ATORVASTATIN CALCIUM 20 MG PO TABS: ORAL_TABLET | ORAL | 1 refills | Status: DC

## 2024-02-15 NOTE — Progress Notes (Signed)
 Established Patient Office Visit     CC/Reason for Visit: Follow-up cholesterol, URI  HPI: Barbara [name] is a 63 y.o. female who is coming in today for the above mentioned reasons. Past Medical History is significant for: Hyperlipidemia on atorvastatin .  She is due for refills on follow-up.  She recently got together with some family members and has been having postnasal drip, runny nose, some fullness of the right ear and redness of the right eye.   Past Medical/Surgical History: Past Medical History:  Diagnosis Date   Allergy    Anxiety    Arthritis    Depression    Hyperlipidemia    no treatment    Pre-diabetes    no meds     Past Surgical History:  Procedure Laterality Date   GUM SURGERY     MANDIBLE FRACTURE SURGERY  1975   TONSILLECTOMY     TUBAL LIGATION     WISDOM TOOTH EXTRACTION      Social History:  reports that she quit smoking about 12 years ago. Her smoking use included cigarettes. She started smoking about 42 years ago. She has a 22.5 pack-year smoking history. She has never used smokeless tobacco. She reports that she does not drink alcohol and does not use drugs.  Allergies: Allergies  Allergen Reactions   Amoxicillin  Rash    Family History:  Family History  Problem Relation Age of Onset   Dementia Mother    Heart attack Father    Diabetes Sister    Heart attack Brother    Diabetes Brother    Heart attack Maternal Grandfather    Diabetes Maternal Grandfather    Cancer Paternal Grandmother 32       breast   Breast cancer Paternal Grandmother    Heart attack Paternal Grandfather    Colon cancer Neg Hx    Colon polyps Neg Hx    Esophageal cancer Neg Hx    Stomach cancer Neg Hx    Rectal cancer Neg Hx      Current Outpatient Medications:    albuterol  (VENTOLIN  HFA) 108 (90 Base) MCG/ACT inhaler, Inhale 2 puffs into the lungs every 6 (six) hours as needed for wheezing or shortness of breath., Disp: 8 g, Rfl: 0   fluocinonide   (LIDEX ) 0.05 % external solution, Apply 1 Application topically 2 (two) times daily. As needed, Disp: 180 mL, Rfl: 3   atorvastatin  (LIPITOR) 20 MG tablet, TAKE 1 TABLET(20 MG) BY MOUTH DAILY, Disp: 90 tablet, Rfl: 1  Review of Systems:  Negative unless indicated in HPI.   Physical Exam: Vitals:   02/15/24 0950  BP: 98/64  Pulse: 73  Temp: 97.8 F (36.6 C)  TempSrc: Oral  SpO2: 98%  Weight: 123 lb 6.4 oz (56 kg)    Body mass index is 21.18 kg/m.   Physical Exam Vitals reviewed.  Constitutional:      Appearance: Normal appearance.  HENT:     Right Ear: Tympanic membrane, ear canal and external ear normal.     Left Ear: Tympanic membrane, ear canal and external ear normal.     Mouth/Throat:     Mouth: Mucous membranes are moist.     Pharynx: Oropharynx is clear.  Eyes:     Conjunctiva/sclera:     Right eye: Right conjunctiva is injected.     Left eye: Left conjunctiva is not injected.     Pupils: Pupils are equal, round, and reactive to light.  Cardiovascular:  Rate and Rhythm: Normal rate and regular rhythm.  Pulmonary:     Effort: Pulmonary effort is normal.     Breath sounds: Normal breath sounds.  Neurological:     Mental Status: She is alert.      Impression and Plan:  Mixed hyperlipidemia -     Lipid panel; Future -     Comprehensive metabolic panel with GFR; Future -     Atorvastatin  Calcium ; TAKE 1 TABLET(20 MG) BY MOUTH DAILY  Dispense: 90 tablet; Refill: 1  Upper respiratory tract infection, unspecified type  - Lipids, check lipid panel and CMP. -Given exam findings, PNA, pharyngitis, ear infection are not likely, hence abx have not been prescribed. -Have advised rest, fluids, OTC antihistamines, cough suppressants and mucinex. -RTC if no improvement in 10-14 days.    Time spent:30 minutes reviewing chart, interviewing and examining patient and formulating plan of care.     Marguerita Shih, MD [facility] at  [facility]

## 2024-02-16 ENCOUNTER — Ambulatory Visit: Payer: Self-pay | Admitting: Internal Medicine

## 2024-02-20 ENCOUNTER — Encounter: Payer: Self-pay | Admitting: Internal Medicine

## 2024-02-20 ENCOUNTER — Ambulatory Visit: Admitting: Internal Medicine

## 2024-02-20 ENCOUNTER — Ambulatory Visit: Payer: Self-pay | Admitting: *Deleted

## 2024-02-20 VITALS — BP 110/64 | HR 105 | Temp 99.5°F | Wt 124.9 lb

## 2024-02-20 DIAGNOSIS — J019 Acute sinusitis, unspecified: Secondary | ICD-10-CM

## 2024-02-20 MED ORDER — DOXYCYCLINE HYCLATE 100 MG PO TABS: 100.0000 mg | ORAL_TABLET | Freq: Two times a day (BID) | ORAL | 0 refills | Status: AC

## 2024-02-20 NOTE — Telephone Encounter (Signed)
 Noted

## 2024-02-20 NOTE — Progress Notes (Signed)
 Established Patient Office Visit     CC/Reason for Visit: Nonproductive cough  HPI: Barbara [name] is a 63 y.o. female who is coming in today for the above mentioned reasons.  She was seen last week for a viral URI.  Advised to do symptomatic management.  She is here today because of progressive symptoms.  She has significant nasal congestion, and nonproductive cough that interferes with work and sleep.  She had a temperature of 101.6 this morning, in office today is 99.5.  She has been taking Advil and Mucinex DM.   Past Medical/Surgical History: Past Medical History:  Diagnosis Date   Allergy    Anxiety    Arthritis    Depression    Hyperlipidemia    no treatment    Pre-diabetes    no meds     Past Surgical History:  Procedure Laterality Date   GUM SURGERY     MANDIBLE FRACTURE SURGERY  1975   TONSILLECTOMY     TUBAL LIGATION     WISDOM TOOTH EXTRACTION      Social History:  reports that she quit smoking about 12 years ago. Her smoking use included cigarettes. She started smoking about 42 years ago. She has a 22.5 pack-year smoking history. She has never used smokeless tobacco. She reports that she does not drink alcohol and does not use drugs.  Allergies: Allergies  Allergen Reactions   Amoxicillin  Rash    Family History:  Family History  Problem Relation Age of Onset   Dementia Mother    Heart attack Father    Diabetes Sister    Heart attack Brother    Diabetes Brother    Heart attack Maternal Grandfather    Diabetes Maternal Grandfather    Cancer Paternal Grandmother 12       breast   Breast cancer Paternal Grandmother    Heart attack Paternal Grandfather    Colon cancer Neg Hx    Colon polyps Neg Hx    Esophageal cancer Neg Hx    Stomach cancer Neg Hx    Rectal cancer Neg Hx      Current Outpatient Medications:    albuterol  (VENTOLIN  HFA) 108 (90 Base) MCG/ACT inhaler, Inhale 2 puffs into the lungs every 6 (six) hours as needed for  wheezing or shortness of breath., Disp: 8 g, Rfl: 0   atorvastatin  (LIPITOR) 20 MG tablet, TAKE 1 TABLET(20 MG) BY MOUTH DAILY, Disp: 90 tablet, Rfl: 1   doxycycline (VIBRA-TABS) 100 MG tablet, Take 1 tablet (100 mg total) by mouth 2 (two) times daily for 7 days., Disp: 14 tablet, Rfl: 0   fluocinonide  (LIDEX ) 0.05 % external solution, Apply 1 Application topically 2 (two) times daily. As needed, Disp: 180 mL, Rfl: 3  Review of Systems:  Negative unless indicated in HPI.   Physical Exam: Vitals:   02/20/24 1310  BP: 110/64  Pulse: (!) 105  Temp: 99.5 F (37.5 C)  TempSrc: Oral  SpO2: 93%  Weight: 124 lb 14.4 oz (56.7 kg)    Body mass index is 21.44 kg/m.   Physical Exam Vitals reviewed.  Constitutional:      Appearance: Normal appearance.  HENT:     Right Ear: Tympanic membrane, ear canal and external ear normal.     Left Ear: Tympanic membrane, ear canal and external ear normal.     Mouth/Throat:     Mouth: Mucous membranes are moist.     Pharynx: Oropharynx is clear.  Eyes:  Conjunctiva/sclera: Conjunctivae normal.     Pupils: Pupils are equal, round, and reactive to light.  Cardiovascular:     Rate and Rhythm: Normal rate and regular rhythm.  Pulmonary:     Effort: Pulmonary effort is normal.     Breath sounds: Rales present.  Neurological:     Mental Status: She is alert.      Impression and Plan:  Acute non-recurrent sinusitis, unspecified location -     Doxycycline Hyclate; Take 1 tablet (100 mg total) by mouth 2 (two) times daily for 7 days.  Dispense: 14 tablet; Refill: 0   - Suspect acute sinusitis plus possibly early pneumonia.  Given amoxicillin  allergy will treat with doxycycline for 7 days.  Time spent:30 minutes reviewing chart, interviewing and examining patient and formulating plan of care.     Marguerita Shih, MD [facility]

## 2024-02-20 NOTE — Telephone Encounter (Signed)
 Copied from CRM [id number]. Topic: Clinical - Red Word Triage >> Feb 20, 2024  8:17 AM Clyde Darling P wrote: [facility] that prompted transfer to Nurse Triage: Flu occur a month ago side hurting/ head hurt Reason for Disposition  [1] Continuous (nonstop) coughing interferes with work or school AND [2] no improvement using cough treatment per Care Advice  Answer Assessment - Initial Assessment Questions 1. ONSET: "When did the cough begin?"      I'm coughing a lot.   I'm coughing up a little but not much.   I'm hurting from coughing so much.   My head and side hurt from coughing so much.    2. SEVERITY: "How bad is the cough today?"      Bad I'm having fever yesterday.  A month ago my husband's family came in and they were all sick with URI.   We all got sick.    3. SPUTUM: "Describe the color of your sputum" (none, dry cough; clear, white, yellow, green)   Yellow/green mucus 4. HEMOPTYSIS: "Are you coughing up any blood?" If so ask: "How much?" (flecks, streaks, tablespoons, etc.)     Not asked 5. DIFFICULTY BREATHING: "Are you having difficulty breathing?" If Yes, ask: "How bad is it?" (e.g., mild, moderate, severe)    - MILD: No SOB at rest, mild SOB with walking, speaks normally in sentences, can lie down, no retractions, pulse < 100.    - MODERATE: SOB at rest, SOB with minimal exertion and prefers to sit, cannot lie down flat, speaks in phrases, mild retractions, audible wheezing, pulse 100-120.    - SEVERE: Very SOB at rest, speaks in single words, struggling to breathe, sitting hunched forward, retractions, pulse > 120      I'm short of breath a little.   The albuterol  inhaler really helps.   6. FEVER: "Do you have a fever?" If Yes, ask: "What is your temperature, how was it measured, and when did it start?"     Yesterday I had fever but not today.  7. CARDIAC HISTORY: "Do you have any history of heart disease?" (e.g., heart attack, congestive heart failure)      Not asked 8. LUNG HISTORY: "Do  you have any history of lung disease?"  (e.g., pulmonary embolus, asthma, emphysema)     No 9. PE RISK FACTORS: "Do you have a history of blood clots?" (or: recent major surgery, recent prolonged travel, bedridden)     Not asked 10. OTHER SYMPTOMS: "Do you have any other symptoms?" (e.g., runny nose, wheezing, chest pain)       Coughing a lot, sore from coughing so much, side and head hurting from coughing so much. 11. PREGNANCY: "Is there any chance you are pregnant?" "When was your last menstrual period?"       N/A 12. TRAVEL: "Have you traveled out of the country in the last month?" (e.g., travel history, exposures)       N/A  Protocols used: Cough - Acute Productive-A-AH  Chief Complaint: Coughing a lot, bringing up scant amount of yellow mucus Symptoms: sinus congestion Frequency: Been going on for a month.   Coughing not going away. Pertinent Negatives: Patient denies fever today but had some yesterday Disposition: [] ED /[] Urgent Care (no appt availability in office) / [x] Appointment(In office/virtual)/ []  [facility]/ [] Home Care/ [] Refused Recommended Disposition /[] [place] [facility]/ []  Follow-up with PCP Additional Notes: Appt made for today with Dr. Ival Marines

## 2024-04-09 ENCOUNTER — Ambulatory Visit: Admitting: Internal Medicine

## 2024-04-09 ENCOUNTER — Encounter: Payer: Self-pay | Admitting: Internal Medicine

## 2024-04-09 ENCOUNTER — Ambulatory Visit: Payer: Self-pay

## 2024-04-09 VITALS — BP 98/62 | HR 80 | Temp 97.8°F | Wt 125.1 lb

## 2024-04-09 DIAGNOSIS — M79671 Pain in right foot: Secondary | ICD-10-CM

## 2024-04-09 MED ORDER — MELOXICAM 7.5 MG PO TABS: 7.5000 mg | ORAL_TABLET | Freq: Every day | ORAL | 0 refills | Status: DC

## 2024-04-09 NOTE — Telephone Encounter (Signed)
 FYI Only or Action Required?: FYI only for provider.  Patient was last seen in primary care on 02/20/2024 by Theophilus Andrews, Tully GRADE, MD.  Called Nurse Triage reporting Foot Pain.  Symptoms began several years ago.  Interventions attempted: Ice/heat application and Other: Epsom salt soak.  Symptoms are: right foot pain intermittent, slight swelling to top of right foot gradually worsening.  Triage Disposition: See PCP Within 2 Weeks  Patient/caregiver understands and will follow disposition?: Yes                          Copied from CRM [id number]. Topic: Clinical - Red Word Triage >> Apr 09, 2024 11:54 AM Barbara [name] wrote: Red Word that prompted transfer to Nurse Triage: ongoing foot pain(right) Reason for Disposition  [1] MILD pain (e.g., does not interfere with normal activities) AND [2] present > 7 days  Answer Assessment - Initial Assessment Questions 1. ONSET: When did the pain start?      X 20 years, comes and goes. Worsens throughout the day.  2. LOCATION: Where is the pain located?      Right foot.  3. PAIN: How bad is the pain?    (Scale 1-10; or mild, moderate, severe)     1-2/10. She states the other day was severe 8/10 and she was unable to walk on it.  4. WORK OR EXERCISE: Has there been any recent work or exercise that involved this part of the body?      No.  5. CAUSE: What do you think is causing the foot pain?     Patient unsure if it is a bone spur or arthritis.   6. OTHER SYMPTOMS: Do you have any other symptoms? (e.g., leg pain, rash, fever, numbness)     Patient states slight swelling on top of right foot. She denies bunion,rash, redness, fever.  7. PREGNANCY: Is there any chance you are pregnant? When was your last menstrual period?     N/A.  Protocols used: Foot Pain-A-AH

## 2024-04-09 NOTE — Progress Notes (Signed)
 Established Patient Office Visit     CC/Reason for Visit: Right foot pain  HPI: Barbara [name] is a 63 y.o. female who is coming in today for the above mentioned reasons.  This pain is intermittent and has been ongoing for 20 years approximately.  Lately it has been bothering her more.  She has a small amount of swelling on the dorsum of her foot between the big toe and the second toe and pain to palpation of the first MTP joint.  When she walks, particularly when wearing hard soled or flat shoes the first MTP joint is more painful.   Past Medical/Surgical History: Past Medical History:  Diagnosis Date   Allergy    Anxiety    Arthritis    Depression    Hyperlipidemia    no treatment    Pre-diabetes    no meds     Past Surgical History:  Procedure Laterality Date   GUM SURGERY     MANDIBLE FRACTURE SURGERY  1975   TONSILLECTOMY     TUBAL LIGATION     WISDOM TOOTH EXTRACTION      Social History:  reports that she quit smoking about 12 years ago. Her smoking use included cigarettes. She started smoking about 42 years ago. She has a 22.5 pack-year smoking history. She has never used smokeless tobacco. She reports that she does not drink alcohol and does not use drugs.  Allergies: Allergies  Allergen Reactions   Amoxicillin  Rash    Family History:  Family History  Problem Relation Age of Onset   Dementia Mother    Heart attack Father    Diabetes Sister    Heart attack Brother    Diabetes Brother    Heart attack Maternal Grandfather    Diabetes Maternal Grandfather    Cancer Paternal Grandmother 33       breast   Breast cancer Paternal Grandmother    Heart attack Paternal Grandfather    Colon cancer Neg Hx    Colon polyps Neg Hx    Esophageal cancer Neg Hx    Stomach cancer Neg Hx    Rectal cancer Neg Hx      Current Outpatient Medications:    albuterol  (VENTOLIN  HFA) 108 (90 Base) MCG/ACT inhaler, Inhale 2 puffs into the lungs every 6 (six) hours  as needed for wheezing or shortness of breath., Disp: 8 g, Rfl: 0   atorvastatin  (LIPITOR) 20 MG tablet, TAKE 1 TABLET(20 MG) BY MOUTH DAILY, Disp: 90 tablet, Rfl: 1   fluocinonide  (LIDEX ) 0.05 % external solution, Apply 1 Application topically 2 (two) times daily. As needed, Disp: 180 mL, Rfl: 3   meloxicam  (MOBIC ) 7.5 MG tablet, Take 1 tablet (7.5 mg total) by mouth daily., Disp: 30 tablet, Rfl: 0  Review of Systems:  Negative unless indicated in HPI.   Physical Exam: Vitals:   04/09/24 1413  BP: 98/62  Pulse: 80  Temp: 97.8 F (36.6 C)  TempSrc: Oral  SpO2: 99%  Weight: 125 lb 1.6 oz (56.7 kg)    Body mass index is 21.47 kg/m.    Impression and Plan:  Right foot pain -     Meloxicam ; Take 1 tablet (7.5 mg total) by mouth daily.  Dispense: 30 tablet; Refill: 0 -     Ambulatory referral to Podiatry   - Suspect this is some mild osteoarthritis, unlikely gout.  Will give meloxicam  for 10 days and place referral to podiatry.  Time spent:30 minutes reviewing  chart, interviewing and examining patient and formulating plan of care.     Tully Theophilus Andrews, MD [facility]

## 2024-04-09 NOTE — Telephone Encounter (Signed)
 Noted

## 2024-04-19 ENCOUNTER — Ambulatory Visit: Admitting: Podiatry

## 2024-04-19 ENCOUNTER — Ambulatory Visit (INDEPENDENT_AMBULATORY_CARE_PROVIDER_SITE_OTHER)

## 2024-04-19 DIAGNOSIS — M205X1 Other deformities of toe(s) (acquired), right foot: Secondary | ICD-10-CM | POA: Diagnosis not present

## 2024-04-19 DIAGNOSIS — M7751 Other enthesopathy of right foot: Secondary | ICD-10-CM | POA: Diagnosis not present

## 2024-04-19 DIAGNOSIS — M79674 Pain in right toe(s): Secondary | ICD-10-CM | POA: Diagnosis not present

## 2024-04-19 MED ORDER — TRIAMCINOLONE ACETONIDE 10 MG/ML IJ SUSP
5.0000 mg | Freq: Once | INTRAMUSCULAR | Status: AC
  Administered 2024-04-19: 5 mg via INTRAMUSCULAR

## 2024-04-19 NOTE — Progress Notes (Signed)
  Plan Start Date: Sep 21, 2023  DED INCLUDED IN OOP $2,500 / Calendar Year(s)  -$0 met Year to Date  Patient is calling Insurance to see if they have met deductible since husbands TKA  Lolita Schultze CPed, CFo, CFm

## 2024-04-19 NOTE — Patient Instructions (Signed)

## 2024-04-19 NOTE — Progress Notes (Signed)
 Subjective:  Patient ID: Barbara [name], female    DOB: 1961-05-10,  MRN: [id number]  Chief Complaint  Patient presents with   Foot Pain    Right foot between the 1st and 2nd toe. Pain worsens as the day goes on. Sharp at times and aching at times. Has tried compression wraps, soaking in epsom/hot water. This has helped temporarily as does ibuprofen. Not diabetic and not on anticoag.      Discussed the use of AI scribe software for clinical note transcription with the patient, who gave verbal consent to proceed.  History of Present Illness Barbara [name] is a 63 year old female who presents with chronic right foot pain between the first and second toe.  She has experienced right foot pain for about twenty years, with symptoms fluctuating over time. The pain is located between the first and second toe and worsens with tight cowboy boots, which previously alleviated symptoms but no longer provide relief. The pain intensifies throughout the day, starting as mild discomfort and becoming excruciating by evening, especially when bending the toe.  Recently, increased pain occurred while painting her house, involving frequent ladder use and being on her tiptoes, which she suspects aggravated her condition. She often wears flip flops in the summer and has noticed swelling around the big toe joint, necessitating a larger shoe size. The pain is primarily in the big toe joint, with no significant pain in the second toe.  She experiences achiness in the foot, which sometimes becomes sharp when walking. She has been taking ibuprofen for pain management but has not picked up a prescription for meloxicam . She is allergic to amoxicillin . No recent injuries to the foot, except for increased pain after painting and using a ladder.      Objective:    Physical Exam General: AAO x3, NAD  Dermatological: Skin is warm, dry and supple bilateral.  There are no open sores, no preulcerative lesions, no  rash or signs of infection present.  Vascular: Dorsalis Pedis artery and Posterior Tibial artery pedal pulses are 2/4 bilateral with immedate capillary fill time.  There is no pain with calf compression, swelling, warmth, erythema.   Neruologic: Grossly intact via light touch bilateral.   Musculoskeletal: There is localized edema along the first MTPJ of the right foot.  There is decreased range of motion in dorsiflexion.  Mild discomfort with range of motion as well as in the first MPJ.  No erythema or warmth.  Gait: Unassisted, Nonantalgic.     No images are attached to the encounter.    Results RADIOLOGY Foot X-ray: Joint space narrowing, osteophyte formation, signs of arthritis.  Increased calcaneal inclination angle.   Assessment:   1. Hallux limitus, right   2. Capsulitis of toe, right      Plan:  Patient was evaluated and treated and all questions answered.  Assessment and Plan Assessment & Plan Hallux limits, right Chronic right hallux limitus with joint space narrowing and bone spur indicating arthritis. - Administered steroid injection to the right big toe joint with informed consent.  Verbal consent obtained.  Cleaned skin with Betadine, alcohol.  Mixture of 1 cc Kenalog  10, 0.5 cc of Marcaine plain, 0.5 cc lidocaine plain was able to radiate into and around the first MPJ without complications.  Postinjection care discussed. - Recommended icing and ibuprofen post-injection for steroid flare management. - Discussed shoe modifications and inserts to reduce joint pressure. - Considered meloxicam  as an alternative anti-inflammatory. - She was seen today  by Lolita for evaluation of orthotics but we held off on this today.    Return if symptoms worsen or fail to improve.   Donnice JONELLE Fees DPM

## 2024-04-23 ENCOUNTER — Other Ambulatory Visit: Payer: Self-pay | Admitting: Internal Medicine

## 2024-04-23 DIAGNOSIS — Z1231 Encounter for screening mammogram for malignant neoplasm of breast: Secondary | ICD-10-CM

## 2024-06-15 ENCOUNTER — Ambulatory Visit
Admission: RE | Admit: 2024-06-15 | Discharge: 2024-06-15 | Disposition: A | Discharge status: Home / Self Care | Source: Ambulatory Visit | Attending: Internal Medicine | Admitting: Internal Medicine

## 2024-06-15 DIAGNOSIS — Z1231 Encounter for screening mammogram for malignant neoplasm of breast: Secondary | ICD-10-CM

## 2024-06-18 ENCOUNTER — Ambulatory Visit (INDEPENDENT_AMBULATORY_CARE_PROVIDER_SITE_OTHER): Admitting: Internal Medicine

## 2024-06-18 ENCOUNTER — Encounter: Payer: Self-pay | Admitting: Internal Medicine

## 2024-06-18 VITALS — BP 110/70 | HR 64 | Temp 97.7°F | Ht 64.0 in | Wt 127.4 lb

## 2024-06-18 DIAGNOSIS — R7303 Prediabetes: Secondary | ICD-10-CM

## 2024-06-18 DIAGNOSIS — Z0000 Encounter for general adult medical examination without abnormal findings: Secondary | ICD-10-CM | POA: Diagnosis not present

## 2024-06-18 DIAGNOSIS — E782 Mixed hyperlipidemia: Secondary | ICD-10-CM

## 2024-06-18 DIAGNOSIS — Z87891 Personal history of nicotine dependence: Secondary | ICD-10-CM

## 2024-06-18 LAB — COMPREHENSIVE METABOLIC PANEL WITH GFR
ALT: 34 U/L (ref 0–35)
AST: 31 U/L (ref 0–37)
Albumin: 4.4 g/dL (ref 3.5–5.2)
Alkaline Phosphatase: 69 U/L (ref 39–117)
BUN: 18 mg/dL (ref 6–23)
CO2: 28 meq/L (ref 19–32)
Calcium: 9.5 mg/dL (ref 8.4–10.5)
Chloride: 107 meq/L (ref 96–112)
Creatinine, Ser: 0.91 mg/dL (ref 0.40–1.20)
GFR: 66.5 mL/min (ref >60.00)
Glucose, Bld: 102 mg/dL — ABNORMAL HIGH (ref 70–99)
Potassium: 4 meq/L (ref 3.5–5.1)
Sodium: 141 meq/L (ref 135–145)
Total Bilirubin: 0.4 mg/dL (ref 0.2–1.2)
Total Protein: 6.9 g/dL (ref 6.0–8.3)

## 2024-06-18 LAB — CBC WITH DIFFERENTIAL/PLATELET
Basophils Absolute: 0.1 K/uL (ref 0.0–0.1)
Basophils Relative: 1.3 % (ref 0.0–3.0)
Eosinophils Absolute: 0.5 K/uL (ref 0.0–0.7)
Eosinophils Relative: 9.3 % — ABNORMAL HIGH (ref 0.0–5.0)
HCT: 41.2 % (ref 36.0–46.0)
Hemoglobin: 13.9 g/dL (ref 12.0–15.0)
Lymphocytes Relative: 31.6 % (ref 12.0–46.0)
Lymphs Abs: 1.7 K/uL (ref 0.7–4.0)
MCHC: 33.6 g/dL (ref 30.0–36.0)
MCV: 90.1 fl (ref 78.0–100.0)
Monocytes Absolute: 0.6 K/uL (ref 0.1–1.0)
Monocytes Relative: 11.1 % (ref 3.0–12.0)
Neutro Abs: 2.5 K/uL (ref 1.4–7.7)
Neutrophils Relative %: 46.7 % (ref 43.0–77.0)
Platelets: 236 K/uL (ref 150.0–400.0)
RBC: 4.58 Mil/uL (ref 3.87–5.11)
RDW: 13.8 % (ref 11.5–15.5)
WBC: 5.5 K/uL (ref 4.0–10.5)

## 2024-06-18 LAB — TSH: TSH: 7.33 u[IU]/mL — ABNORMAL HIGH (ref 0.35–5.50)

## 2024-06-18 LAB — HEMOGLOBIN A1C: Hgb A1c MFr Bld: 6.3 % (ref 4.6–6.5)

## 2024-06-18 LAB — VITAMIN D 25 HYDROXY (VIT D DEFICIENCY, FRACTURES): VITD: 36.49 ng/mL (ref 30.00–100.00)

## 2024-06-18 LAB — LIPID PANEL
Cholesterol: 142 mg/dL (ref 0–200)
HDL: 44.1 mg/dL (ref >39.00)
LDL Cholesterol: 76 mg/dL (ref 0–99)
NonHDL: 98.11
Total CHOL/HDL Ratio: 3
Triglycerides: 113 mg/dL (ref 0.0–149.0)
VLDL: 22.6 mg/dL (ref 0.0–40.0)

## 2024-06-18 LAB — VITAMIN B12: Vitamin B-12: 780 pg/mL (ref 211–911)

## 2024-06-18 NOTE — Progress Notes (Signed)
 Established Patient Office Visit     CC/Reason for Visit: Annual preventive exam  HPI: Barbara [name] is a 63 y.o. female who is coming in today for the above mentioned reasons. Past Medical History is significant for: Impaired glucose tolerance, hyperlipidemia.  Feeling well, no concerns or complaints.  Has routine eye and dental care.  She is due for COVID, flu, pneumonia, Shingrix vaccine.  She had a mammogram last week.  Colonoscopy in 2021, follow-up with GYN annually.   Past Medical/Surgical History: Past Medical History:  Diagnosis Date   Allergy    Anxiety    Arthritis    Depression    Hyperlipidemia    no treatment    Pre-diabetes    no meds     Past Surgical History:  Procedure Laterality Date   GUM SURGERY     MANDIBLE FRACTURE SURGERY  1975   TONSILLECTOMY     TUBAL LIGATION     WISDOM TOOTH EXTRACTION      Social History:  reports that she quit smoking about 12 years ago. Her smoking use included cigarettes. She started smoking about 42 years ago. She has a 22.5 pack-year smoking history. She has never used smokeless tobacco. She reports that she does not drink alcohol and does not use drugs.  Allergies: Allergies  Allergen Reactions   Amoxicillin  Rash    Family History:  Family History  Problem Relation Age of Onset   Dementia Mother    Heart attack Father    Diabetes Sister    Heart attack Brother    Diabetes Brother    Heart attack Maternal Grandfather    Diabetes Maternal Grandfather    Cancer Paternal Grandmother 71       breast   Breast cancer Paternal Grandmother    Heart attack Paternal Grandfather    Colon cancer Neg Hx    Colon polyps Neg Hx    Esophageal cancer Neg Hx    Stomach cancer Neg Hx    Rectal cancer Neg Hx      Current Outpatient Medications:    albuterol  (VENTOLIN  HFA) 108 (90 Base) MCG/ACT inhaler, Inhale 2 puffs into the lungs every 6 (six) hours as needed for wheezing or shortness of breath., Disp: 8 g,  Rfl: 0   atorvastatin  (LIPITOR) 20 MG tablet, TAKE 1 TABLET(20 MG) BY MOUTH DAILY, Disp: 90 tablet, Rfl: 1   fluocinonide  (LIDEX ) 0.05 % external solution, Apply 1 Application topically 2 (two) times daily. As needed, Disp: 180 mL, Rfl: 3  Review of Systems:  Negative unless indicated in HPI.   Physical Exam: Vitals:   06/18/24 0748  BP: 110/70  Pulse: 64  Temp: 97.7 F (36.5 C)  TempSrc: Oral  SpO2: 99%  Weight: 127 lb 6.4 oz (57.8 kg)  Height: 5' 4 (1.626 m)    Body mass index is 21.87 kg/m.   Physical Exam Vitals reviewed.  Constitutional:      General: She is not in acute distress.    Appearance: Normal appearance. She is not ill-appearing, toxic-appearing or diaphoretic.  HENT:     Head: Normocephalic.     Right Ear: Tympanic membrane, ear canal and external ear normal. There is no impacted cerumen.     Left Ear: Tympanic membrane, ear canal and external ear normal. There is no impacted cerumen.     Nose: Nose normal.     Mouth/Throat:     Mouth: Mucous membranes are moist.     Pharynx:  Oropharynx is clear. No oropharyngeal exudate or posterior oropharyngeal erythema.  Eyes:     General: No scleral icterus.       Right eye: No discharge.        Left eye: No discharge.     Conjunctiva/sclera: Conjunctivae normal.     Pupils: Pupils are equal, round, and reactive to light.  Neck:     Vascular: No carotid bruit.  Cardiovascular:     Rate and Rhythm: Normal rate and regular rhythm.     Pulses: Normal pulses.     Heart sounds: Normal heart sounds.  Pulmonary:     Effort: Pulmonary effort is normal. No respiratory distress.     Breath sounds: Normal breath sounds.  Abdominal:     General: Abdomen is flat. Bowel sounds are normal.     Palpations: Abdomen is soft.  Musculoskeletal:        General: Normal range of motion.     Cervical back: Normal range of motion.  Skin:    General: Skin is warm and dry.  Neurological:     General: No focal deficit present.      Mental Status: She is alert and oriented to person, place, and time. Mental status is at baseline.  Psychiatric:        Mood and Affect: Mood normal.        Behavior: Behavior normal.        Thought Content: Thought content normal.        Judgment: Judgment normal.      Impression and Plan:  Encounter for preventive health examination -     TSH; Future -     Vitamin B12; Future -     VITAMIN D 25 Hydroxy (Vit-D Deficiency, Fractures); Future  Mixed hyperlipidemia -     CBC with Differential/Platelet; Future -     Comprehensive metabolic panel with GFR; Future -     Lipid panel; Future  Prediabetes -     Hemoglobin A1c; Future  Former smoker   -Recommend routine eye and dental care. -Healthy lifestyle discussed in detail. -Labs to be updated today. -Prostate cancer screening: Not applicable Health Maintenance  Topic Date Due   COVID-19 Vaccine (1) Never done   Screening for Lung Cancer  06/20/2019   Zoster (Shingles) Vaccine (1 of 2) 09/17/2024*   Flu Shot  12/18/2024*   Pneumococcal Vaccine for age over 65 (1 of 1 - PCV) 06/18/2025*   Breast Cancer Screening  05/25/2025   Colon Cancer Screening  12/24/2026   Pap with HPV screening  03/02/2028   DTaP/Tdap/Td vaccine (3 - Td or Tdap) 06/09/2032   Hepatitis C Screening  Completed   HIV Screening  Completed   Hepatitis B Vaccine  Aged Out   HPV Vaccine  Aged Out   Meningitis B Vaccine  Aged Out  *Topic was postponed. The date shown is not the original due date.     -Declines all vaccines today despite counseling. - Had mammogram last week.     Tully Theophilus Andrews, MD [facility]

## 2024-06-19 ENCOUNTER — Ambulatory Visit: Payer: Self-pay | Admitting: Internal Medicine

## 2024-06-19 DIAGNOSIS — R7989 Other specified abnormal findings of blood chemistry: Secondary | ICD-10-CM

## 2024-06-21 ENCOUNTER — Other Ambulatory Visit (INDEPENDENT_AMBULATORY_CARE_PROVIDER_SITE_OTHER)

## 2024-06-21 DIAGNOSIS — R7989 Other specified abnormal findings of blood chemistry: Secondary | ICD-10-CM | POA: Diagnosis not present

## 2024-06-21 LAB — T3, FREE: T3, Free: 2.9 pg/mL (ref 2.3–4.2)

## 2024-06-21 LAB — T4, FREE: Free T4: 0.63 ng/dL (ref 0.60–1.60)

## 2024-06-26 ENCOUNTER — Ambulatory Visit: Payer: Self-pay | Admitting: Internal Medicine

## 2024-06-26 DIAGNOSIS — E039 Hypothyroidism, unspecified: Secondary | ICD-10-CM

## 2024-06-26 MED ORDER — LEVOTHYROXINE SODIUM 50 MCG PO TABS: 50.0000 ug | ORAL_TABLET | Freq: Every day | ORAL | 1 refills | Status: AC

## 2024-08-13 ENCOUNTER — Other Ambulatory Visit: Payer: Self-pay | Admitting: Internal Medicine

## 2024-08-13 DIAGNOSIS — E782 Mixed hyperlipidemia: Secondary | ICD-10-CM

## 2024-08-13 MED ORDER — ATORVASTATIN CALCIUM 20 MG PO TABS: ORAL_TABLET | ORAL | 1 refills | Status: AC

## 2024-08-13 NOTE — Telephone Encounter (Signed)
 Copied from CRM #[id number]. Topic: Clinical - Medication Refill >> Aug 13, 2024  9:04 AM Barbara [name] wrote: Medication: atorvastatin  (LIPITOR) 20 MG tablet  Has the patient contacted their pharmacy? Yes (Agent: If no, request that the patient contact the pharmacy for the refill. If patient does not wish to contact the pharmacy document the reason why and proceed with request.) (Agent: If yes, when and what did the pharmacy advise?) need refill/ script   This is the patient's preferred pharmacy:  [facility] - [place], [place] - [address] Barbara [name] [place] [address] Phone: [phone] Fax: [phone]  Is this the correct pharmacy for this prescription? Yes If no, delete pharmacy and type the correct one.   Has the prescription been filled recently? No  Is the patient out of the medication? Yes  Has the patient been seen for an appointment in the last year OR does the patient have an upcoming appointment? Yes  Can we respond through MyChart? Yes   Agent: Please be advised that Rx refills may take up to 3 business days. We ask that you follow-up with your pharmacy.
